# Patient Record
Sex: Female | Born: 1985 | Race: White | Hispanic: No | Marital: Married | Smoking: Current every day smoker
Health system: Southern US, Community
[De-identification: ages and names within clinical notes are randomized; demographics above are authoritative.]

## PROBLEM LIST (undated history)

## (undated) DIAGNOSIS — J039 Acute tonsillitis, unspecified: Secondary | ICD-10-CM

## (undated) DIAGNOSIS — R238 Other skin changes: Secondary | ICD-10-CM

## (undated) DIAGNOSIS — R531 Weakness: Secondary | ICD-10-CM

## (undated) DIAGNOSIS — S46819A Strain of other muscles, fascia and tendons at shoulder and upper arm level, unspecified arm, initial encounter: Secondary | ICD-10-CM

## (undated) DIAGNOSIS — E282 Polycystic ovarian syndrome: Secondary | ICD-10-CM

## (undated) DIAGNOSIS — R233 Spontaneous ecchymoses: Secondary | ICD-10-CM

## (undated) DIAGNOSIS — R634 Abnormal weight loss: Secondary | ICD-10-CM

## (undated) DIAGNOSIS — R1033 Periumbilical pain: Secondary | ICD-10-CM

## (undated) DIAGNOSIS — J309 Allergic rhinitis, unspecified: Secondary | ICD-10-CM

## (undated) DIAGNOSIS — R51 Headache: Secondary | ICD-10-CM

## (undated) DIAGNOSIS — G43909 Migraine, unspecified, not intractable, without status migrainosus: Secondary | ICD-10-CM

## (undated) DIAGNOSIS — R197 Diarrhea, unspecified: Secondary | ICD-10-CM

## (undated) DIAGNOSIS — R519 Headache, unspecified: Secondary | ICD-10-CM

## (undated) DIAGNOSIS — S43499A Other sprain of unspecified shoulder joint, initial encounter: Secondary | ICD-10-CM

## (undated) HISTORY — DX: Strain of other muscles, fascia and tendons at shoulder and upper arm level, unspecified arm, initial encounter: S46.819A

## (undated) HISTORY — DX: Headache, unspecified: R51.9

## (undated) HISTORY — DX: Abnormal weight loss: R63.4

## (undated) HISTORY — DX: Headache: R51

## (undated) HISTORY — DX: Weakness: R53.1

## (undated) HISTORY — DX: Other sprain of unspecified shoulder joint, initial encounter: S43.499A

## (undated) HISTORY — DX: Other skin changes: R23.8

## (undated) HISTORY — DX: Migraine, unspecified, not intractable, without status migrainosus: G43.909

## (undated) HISTORY — DX: Periumbilical pain: R10.33

## (undated) HISTORY — DX: Allergic rhinitis, unspecified: J30.9

## (undated) HISTORY — DX: Polycystic ovarian syndrome: E28.2

## (undated) HISTORY — DX: Diarrhea, unspecified: R19.7

## (undated) HISTORY — DX: Spontaneous ecchymoses: R23.3

## (undated) HISTORY — DX: Acute tonsillitis, unspecified: J03.90

---
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
## 2007-07-13 ENCOUNTER — Ambulatory Visit: Payer: Self-pay | Admitting: Internal Medicine

## 2007-07-13 LAB — CONVERTED CEMR LAB: Streptococcus, Group A Screen (Direct): NEGATIVE

## 2008-01-12 ENCOUNTER — Ambulatory Visit: Payer: Self-pay | Admitting: Internal Medicine

## 2008-01-12 DIAGNOSIS — R519 Headache, unspecified: Secondary | ICD-10-CM | POA: Insufficient documentation

## 2008-01-12 DIAGNOSIS — R51 Headache: Secondary | ICD-10-CM | POA: Insufficient documentation

## 2008-02-07 ENCOUNTER — Ambulatory Visit: Payer: Self-pay | Admitting: Internal Medicine

## 2008-02-07 DIAGNOSIS — R42 Dizziness and giddiness: Secondary | ICD-10-CM | POA: Insufficient documentation

## 2008-02-07 DIAGNOSIS — J309 Allergic rhinitis, unspecified: Secondary | ICD-10-CM

## 2008-02-07 HISTORY — DX: Allergic rhinitis, unspecified: J30.9

## 2008-02-08 ENCOUNTER — Ambulatory Visit: Payer: Self-pay | Admitting: Internal Medicine

## 2008-02-14 ENCOUNTER — Encounter: Payer: Self-pay | Admitting: Internal Medicine

## 2008-02-14 ENCOUNTER — Ambulatory Visit: Payer: Self-pay

## 2008-02-20 ENCOUNTER — Ambulatory Visit: Payer: Self-pay | Admitting: Internal Medicine

## 2008-02-28 ENCOUNTER — Encounter: Admission: RE | Admit: 2008-02-28 | Discharge: 2008-02-28 | Payer: Self-pay | Admitting: Internal Medicine

## 2008-02-29 ENCOUNTER — Encounter: Payer: Self-pay | Admitting: Internal Medicine

## 2008-03-07 ENCOUNTER — Emergency Department (HOSPITAL_COMMUNITY): Admission: EM | Admit: 2008-03-07 | Discharge: 2008-03-08 | Payer: Self-pay | Admitting: Emergency Medicine

## 2008-09-13 ENCOUNTER — Ambulatory Visit: Payer: Self-pay | Admitting: Internal Medicine

## 2008-09-13 DIAGNOSIS — J039 Acute tonsillitis, unspecified: Secondary | ICD-10-CM | POA: Insufficient documentation

## 2008-09-13 HISTORY — DX: Acute tonsillitis, unspecified: J03.90

## 2008-12-24 ENCOUNTER — Ambulatory Visit: Payer: Self-pay | Admitting: Internal Medicine

## 2009-01-07 ENCOUNTER — Encounter: Payer: Self-pay | Admitting: Internal Medicine

## 2009-05-03 ENCOUNTER — Telehealth: Payer: Self-pay | Admitting: Internal Medicine

## 2010-02-03 ENCOUNTER — Ambulatory Visit: Payer: Self-pay | Admitting: Internal Medicine

## 2010-02-03 DIAGNOSIS — S43499A Other sprain of unspecified shoulder joint, initial encounter: Secondary | ICD-10-CM

## 2010-02-03 DIAGNOSIS — G43909 Migraine, unspecified, not intractable, without status migrainosus: Secondary | ICD-10-CM

## 2010-02-03 DIAGNOSIS — S46819A Strain of other muscles, fascia and tendons at shoulder and upper arm level, unspecified arm, initial encounter: Secondary | ICD-10-CM

## 2010-02-03 HISTORY — DX: Other sprain of unspecified shoulder joint, initial encounter: S43.499A

## 2010-02-03 HISTORY — DX: Migraine, unspecified, not intractable, without status migrainosus: G43.909

## 2010-07-21 ENCOUNTER — Emergency Department (HOSPITAL_COMMUNITY): Admission: EM | Admit: 2010-07-21 | Discharge: 2010-07-21 | Payer: Self-pay | Admitting: Emergency Medicine

## 2010-07-30 ENCOUNTER — Telehealth: Payer: Self-pay | Admitting: Internal Medicine

## 2010-08-11 ENCOUNTER — Telehealth: Payer: Self-pay | Admitting: Internal Medicine

## 2010-08-18 ENCOUNTER — Ambulatory Visit: Payer: Self-pay | Admitting: Internal Medicine

## 2010-08-26 ENCOUNTER — Encounter: Payer: Self-pay | Admitting: Internal Medicine

## 2010-10-18 DIAGNOSIS — R87619 Unspecified abnormal cytological findings in specimens from cervix uteri: Secondary | ICD-10-CM

## 2010-10-18 HISTORY — DX: Unspecified abnormal cytological findings in specimens from cervix uteri: R87.619

## 2010-11-15 LAB — CONVERTED CEMR LAB
ALT: 13 units/L (ref 0–35)
Alkaline Phosphatase: 51 units/L (ref 39–117)
BUN: 6 mg/dL (ref 6–23)
Basophils Relative: 0 % (ref 0.0–1.0)
Bilirubin Urine: NEGATIVE
Bilirubin, Direct: 0.1 mg/dL (ref 0.0–0.3)
CO2: 28 meq/L (ref 19–32)
Creatinine, Ser: 0.7 mg/dL (ref 0.4–1.2)
Eosinophils Absolute: 0.2 10*3/uL (ref 0.0–0.7)
Eosinophils Relative: 4.6 % (ref 0.0–5.0)
GFR calc Af Amer: 137 mL/min
Hemoglobin, Urine: NEGATIVE
Hemoglobin: 13.7 g/dL (ref 12.0–15.0)
Leukocytes, UA: NEGATIVE
Monocytes Relative: 10.1 % (ref 3.0–12.0)
Neutro Abs: 2 10*3/uL (ref 1.4–7.7)
Platelets: 320 10*3/uL (ref 150–400)
Potassium: 4.2 meq/L (ref 3.5–5.1)
RBC: 4.2 M/uL (ref 3.87–5.11)
Total Bilirubin: 0.5 mg/dL (ref 0.3–1.2)
Total Protein, Urine: NEGATIVE mg/dL
Total Protein: 6.8 g/dL (ref 6.0–8.3)
WBC: 4.1 10*3/uL — ABNORMAL LOW (ref 4.5–10.5)

## 2010-11-17 NOTE — Consult Note (Signed)
Summary: [facility]  [facility]   Imported By: Lester Cottondale 09/01/2010 09:10:00  _____________________________________________________________________  External Attachment:    Type:   Image     Comment:   External Document

## 2010-11-17 NOTE — Progress Notes (Signed)
Summary: Rx for migraine?  Phone Note Call from Patient Call back at Home Phone [phone]   Caller: Dad Summary of Call: Pt's father called stating pt does not have appt with Neuro for migraines until 12/21. Pt is requesting Rx for Amitriptyline to use preventively until appt. Please advise. Initial call taken by: Margaret Pyle, CMA,  August 11, 2010 8:18 AM  Follow-up for Phone Call        done hardcopy to LIM side B - dahlia  Follow-up by: [name] MD,  August 11, 2010 12:55 PM  Additional Follow-up for Phone Call Additional follow up Details #1::        Pt informed via VM, req a call back to advise of pharmacy. Margaret Pyle, CMA  August 11, 2010 1:06 PM   Pt called back and req Rx fax to [facility]. Rx faxed. Additional Follow-up by: Margaret Pyle, CMA,  August 11, 2010 4:51 PM    New/Updated Medications: AMITRIPTYLINE HCL 10 MG TABS (AMITRIPTYLINE HCL) 2 by mouth at bedtime Prescriptions: AMITRIPTYLINE HCL 10 MG TABS (AMITRIPTYLINE HCL) 2 by mouth at bedtime  #60 x 5   Entered and Authorized by:   [name] MD   Signed by:   [name] MD on 08/11/2010   Method used:   Print then Give to Patient   RxID:   [id number]

## 2010-11-17 NOTE — Progress Notes (Signed)
Summary: Referral  Phone Note Call from Patient Call back at Home Phone [phone]   Caller: Myrtie Hawk [phone] Summary of Call: Pt's spouse called requesting referral to [facility] for eval of Migraines. Initial call taken by: Margaret Pyle, CMA,  July 30, 2010 1:10 PM  Follow-up for Phone Call        would she prefer [facility] since the neurologist there specializes in migraine? Follow-up by: [name] MD,  July 30, 2010 2:28 PM  Additional Follow-up for Phone Call Additional follow up Details #1::        Pt has been to [facility] and [facility] and was not satisfied with treatment plan by either MD, this is why pt is requesting new referral per spouse Additional Follow-up by: Margaret Pyle, CMA,  July 30, 2010 2:41 PM    Additional Follow-up for Phone Call Additional follow up Details #2::    ok for refferall as she requests- will do Follow-up by: [name] MD,  July 30, 2010 2:48 PM

## 2010-11-17 NOTE — Assessment & Plan Note (Signed)
Summary: MIGRAINE---STC   Vital Signs:  Patient profile:   25 year old female Height:      63.5 inches Weight:      136.25 pounds BMI:     23.84 O2 Sat:      97 % on Room air Temp:     98.5 degrees F oral Pulse rate:   80 / minute BP sitting:   90 / 58  (left arm) Cuff size:   regular  Vitals Entered By: Zella Ball Ewing CMA Duncan Dull) (August 18, 2010 2:39 PM)  O2 Flow:  Room air CC: Migraines/RE   CC:  Migraines/RE.  History of Present Illness: here for f/u'  has ongoing recurring headaches, dx per pt as migraine;  seen per [facility] but not really improving in freq and severity despite mult med trials;  has appt for [facility] in Dec but pain too much recurrent and severe to wiat that long; has taken herself off most of her meds including the depakote, topaax, naproxen, flexeril, gabapentin and tizanidine;  recently started the elavil at night and for a few days seemed to help, but the past 2 days has awoke with headache, feeling also weak and dizzy and had to sit and wait for several minutes to let it pass; Pt denies CP, worsening sob, doe, wheezing, orthopnea, pnd, worsening LE edema, palps, dizziness or syncope  Pt denies new neuro symptoms such as other headache, facial or extremity weakness  No fever, wt loss, night sweats, loss of appetite or other constitutional symptoms Denies worsening depressive symptoms, suicidal ideation, or panic.    Problems Prior to Update: 1)  Migraine Headache  (ICD-346.90) 2)  Sprain&strain Oth Spec Sites Shoulder&upper Arm  (ICD-840.8) 3)  Acute Tonsillitis  (ICD-463) 4)  Allergic Rhinitis  (ICD-477.9) 5)  Dizziness  (ICD-780.4) 6)  Headache  (ICD-784.0)  Medications Prior to Update: 1)  Yaz 3-0.02 Mg  Tabs (Drospirenone-Ethinyl Estradiol) .Marland Kitchen.. 1 By Mouth Qd 2)  Flonase 50 Mcg/act  Susp (Fluticasone Propionate) .... 2 Sprays Each Nostril 3)  Topamax 100 Mg Tabs (Topiramate) 4)  Tizanidine Hcl 4 Mg Tabs (Tizanidine Hcl) 5)  Gabapentin 300 Mg  Caps (Gabapentin) 6)  Prelone 15 Mg/33ml Syrp (Prednisolone) .... Swish and Swallow 5 Ml By Mouth Three Times A Day For 5 Days 7)  Depakote 500 Mg Tbec (Divalproex Sodium) .Marland Kitchen.. 1 By Mouth Two Times A Day 8)  Verapamil Hcl Cr 100 Mg Xr24h-Cap (Verapamil Hcl) .Marland Kitchen.. 1 By Mouth Once Daily 9)  Naproxen 500 Mg Tabs (Naproxen) .Marland Kitchen.. 1po Two Times A Day As Needed Pain 10)  Flexeril 5 Mg Tabs (Cyclobenzaprine Hcl) .Marland Kitchen.. 1po Three Times A Day As Needed 11)  Amitriptyline Hcl 10 Mg Tabs (Amitriptyline Hcl) .... 2 By Mouth At Bedtime  Current Medications (verified): 1)  Yaz 3-0.02 Mg  Tabs (Drospirenone-Ethinyl Estradiol) .Marland Kitchen.. 1 By Mouth Qd 2)  Verapamil Hcl Cr 100 Mg Xr24h-Cap (Verapamil Hcl) .Marland Kitchen.. 1 By Mouth Once Daily 3)  Naproxen 500 Mg Tabs (Naproxen) .Marland Kitchen.. 1po Two Times A Day As Needed Pain 4)  Sumatriptan Succinate 100 Mg Tabs (Sumatriptan Succinate) .Marland Kitchen.. 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   Head:  normocephalic and atraumatic.   Eyes:  vision grossly intact, pupils equal, and pupils round.   Ears:  R ear normal and L ear normal.   Nose:  no external deformity and no nasal discharge.   Mouth:  no gingival abnormalities and pharynx pink and moist.   Neck:  supple and no masses.   Lungs:  normal respiratory effort and normal breath sounds.   Heart:  normal rate and regular rhythm.   Extremities:  no edema, no erythema    Neurologic:  cranial nerves II-XII intact, strength normal in all extremities, and gait normal.     Impression & Recommendations:  Problem # 1:  MIGRAINE HEADACHE (ICD-346.90)  The following medications were removed from the medication list:    Naproxen 500 Mg Tabs (Naproxen) .Marland Kitchen... 1po two times a day as needed pain Her updated medication list for this problem includes:    Naproxen 500 Mg Tabs (Naproxen) .Marland Kitchen... 1po two times a day as needed pain    Sumatriptan Succinate 100 Mg Tabs (Sumatriptan succinate) .Marland Kitchen... 1po every other day as needed treat as above, f/u any worsening signs or symptoms , also refer Dr Leane Para [facility] ongoing eval and treatment  Orders: Neurology Referral (Neuro)  Complete Medication List: 1)  Yaz 3-0.02 Mg Tabs (Drospirenone-ethinyl estradiol) .Marland Kitchen.. 1 by mouth qd 2)  Verapamil Hcl Cr 100 Mg Xr24h-cap (Verapamil hcl) .Marland Kitchen.. 1 by mouth once daily 3)  Naproxen 500 Mg Tabs (Naproxen) .Marland Kitchen.. 1po two times a day as needed pain 4)  Sumatriptan Succinate 100 Mg Tabs (Sumatriptan succinate) .Marland Kitchen.. 1po every other day as needed  Patient Instructions: 1)  Please take all new medications as prescribed  2)  Continue all previous medications as before this visit , except ok to continue off the topamax, and other medications as you have already stopped 3)  You will be contacted about the referral(s) to: Dr Clarisse Gouge  - Neurology 4)  Please schedule a follow-up appointment as needed. Prescriptions: SUMATRIPTAN SUCCINATE 100 MG TABS (SUMATRIPTAN SUCCINATE) 1po every other day as needed  #9 x 0   Entered and Authorized by:   [name] MD   Signed by:   [name] MD on 08/18/2010   Method used:   Print then Give to Patient   RxID:   [id number] NAPROXEN 500 MG TABS (NAPROXEN) 1po two times a day as needed pain  #60 x 2   Entered and Authorized by:   [name] MD   Signed by:   [name] MD on 08/18/2010   Method used:   Print then Give to Patient   RxID:    [id number]    Orders Added: 1)  Neurology Referral [Neuro] 2)  Est. Patient Level III [28413]

## 2010-11-17 NOTE — Assessment & Plan Note (Signed)
Summary: SHOULDER PAIN  D/T--STC   Vital Signs:  Patient profile:   25 year old female Height:      63 inches Weight:      147.50 pounds BMI:     26.22 O2 Sat:      97 % on Room air Temp:     98 degrees F oral Pulse rate:   82 / minute BP sitting:   100 / 62  (left arm) Cuff size:   regular  Vitals Entered ByZella Ball Ewing (February 03, 2010 4:25 PM)  O2 Flow:  Room air CC: Right Shoulder Pain for 3 weeks/RE   CC:  Right Shoulder Pain for 3 weeks/RE.  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  Is right handed.  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  Pain is burning type with sharp exacerbations,  worse to lift to put paper in bins above shoulder level .  No fever, ST, cough , sob, and Pt denies CP, sob, doe, wheezing, orthopnea, pnd, worsening LE edema, palps, dizziness or syncope   No fall, injury, denies spinal or back pain, headache , bowel or bladder change, rash, joint pain , itch or sweling.  No wt loss ,depressive symptoms or panic.   Does state current med tx for migraine not helping as much as he had hoped, infact seem some what worse in freq and severity., and doesnt like the depakote causing wt gain.    Problems Prior to Update: 1)  Sprain&strain Oth Spec Sites Shoulder&upper Arm  (ICD-840.8) 2)  Acute Tonsillitis  (ICD-463) 3)  Allergic Rhinitis  (ICD-477.9) 4)  Dizziness  (ICD-780.4) 5)  Headache  (ICD-784.0)  Medications Prior to Update: 1)  Yaz 3-0.02 Mg  Tabs (Drospirenone-Ethinyl Estradiol) .Marland Kitchen.. 1 By Mouth Qd 2)  Flonase 50 Mcg/act  Susp (Fluticasone Propionate) .... 2 Sprays Each Nostril 3)  Topamax 100 Mg Tabs (Topiramate) 4)  Tizanidine Hcl 4 Mg Tabs (Tizanidine Hcl) 5)  Gabapentin 300 Mg  Caps (Gabapentin) 6)  Prelone 15 Mg/85ml Syrp (Prednisolone) .... Swish and Swallow 5 Ml By Mouth Three Times A Day For 5 Days 7)  Azithromycin 250 Mg Tabs (Azithromycin) .... 2po Qd For 1 Day, Then 1po Qd For 4days, Then Stop  Current Medications (verified): 1)  Yaz 3-0.02 Mg  Tabs (Drospirenone-Ethinyl Estradiol) .Marland Kitchen.. 1 By Mouth Qd 2)  Flonase 50 Mcg/act  Susp (Fluticasone Propionate) .... 2 Sprays Each Nostril 3)  Topamax 100 Mg Tabs (Topiramate) 4)  Tizanidine Hcl 4 Mg Tabs (Tizanidine Hcl) 5)  Gabapentin 300 Mg Caps (Gabapentin) 6)  Prelone 15 Mg/56ml Syrp (Prednisolone) .... Swish and Swallow 5 Ml By Mouth Three Times A Day For 5 Days 7)  Depakote 500 Mg Tbec (Divalproex Sodium) .Marland Kitchen.. 1 By Mouth Two Times A Day 8)  Verapamil Hcl Cr 100 Mg Xr24h-Cap (Verapamil Hcl) .Marland Kitchen.. 1 By Mouth Once Daily 9)  Naproxen 500 Mg Tabs (Naproxen) .Marland Kitchen.. 1po Two Times A Day As Needed Pain 10)  Flexeril 5 Mg Tabs (Cyclobenzaprine Hcl) .Marland Kitchen.. 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   Head:  normocephalic and atraumatic.   Eyes:  vision grossly intact, pupils equal, and pupils round.   Ears:  R ear normal and L ear normal.   Nose:  no external deformity and no nasal discharge.   Mouth:  no gingival abnormalities and pharynx pink and moist.   Neck:  supple and no masses.   Lungs:  normal respiratory effort and normal breath sounds.     Heart:  normal rate and regular rhythm.   Abdomen:  soft, non-tender, and normal bowel sounds.   Msk:  right shoulder NT, FROM  without sweling;  right trapezoid mild to mod tender, no spine tender Neurologic:  cranial nerves II-XII intact, strength normal in all extremities, sensation intact to light touch, gait normal, and DTRs symmetrical and normal.     Impression & Recommendations:  Problem # 1:  SPRAIN&STRAIN OTH SPEC SITES SHOULDER&UPPER ARM (ICD-840.8) c/w MSK strain ; cant r/o pulm completely so will check cxr, but tx with nsaid and flexeril as needed , f/u any worsening symptoms  Orders: T-2 View CXR, Same Day (71020.5TC)  Problem # 2:  MIGRAINE HEADACHE (ICD-346.90)  Her updated medication list for this problem includes:    Naproxen 500 Mg Tabs (Naproxen) .Marland Kitchen... 1po two times a day as needed pain stable to midl worsened overall by hx and exam, ok to continue meds/tx as is , [facility]  Complete Medication List: 1)  Yaz 3-0.02 Mg Tabs (Drospirenone-ethinyl estradiol) .Marland Kitchen.. 1 by mouth qd 2)  Flonase 50 Mcg/act Susp (Fluticasone propionate) .... 2 sprays each nostril 3)  Topamax 100 Mg Tabs (Topiramate) 4)  Tizanidine Hcl 4 Mg Tabs (Tizanidine hcl) 5)  Gabapentin 300 Mg Caps (Gabapentin) 6)  Prelone 15 Mg/68ml Syrp (Prednisolone) .... Swish and swallow 5 ml by mouth three times a day for 5 days 7)  Depakote 500 Mg Tbec (Divalproex sodium) .Marland Kitchen.. 1 by mouth two times a day 8)  Verapamil Hcl Cr 100 Mg Xr24h-cap (Verapamil hcl) .Marland Kitchen.. 1 by mouth once daily 9)  Naproxen 500 Mg Tabs (Naproxen) .Marland Kitchen.. 1po two times a day as needed pain 10)  Flexeril 5 Mg Tabs (Cyclobenzaprine hcl) .Marland Kitchen.. 1po three times a day as needed  Patient Instructions: 1)  Please take all new medications as prescribed 2)  Continue all previous medications as before this visit  3)  Please go to Radiology in the basement level for your X-Ray today  4)  Please schedule a follow-up appointment as  needed. 5)  [facility] Prescriptions: FLEXERIL 5 MG TABS (CYCLOBENZAPRINE HCL) 1po three times a day as needed  #60 x 1   Entered and Authorized by:   [name] MD   Signed by:   [name] MD on 02/03/2010   Method used:   Print then Give to Patient   RxID:   [id number] NAPROXEN 500 MG TABS (NAPROXEN) 1po two times a day as needed pain  #60 x 2   Entered and Authorized by:   Len Blalock  Zahniya Zellars MD   Signed by:   [name] MD on 02/03/2010   Method used:   Print then Give to Patient   RxID:   [id number]

## 2011-03-02 NOTE — Assessment & Plan Note (Signed)
[facility]                             PULMONARY OFFICE NOTE   TANISA, LAGACE                      MRN:          [id number]  DATE:07/13/2007                            DOB:          Feb 22, 1986    HISTORY OF PRESENT ILLNESS:  The patient is a 25 year old white female  patient of Dr. Jonny Ruiz who was in her usual good state of health until 2  days ago when she got nasal congestion, postnasal drip, sore throat,  sneezing, body aches, and general malaise.  The patient denies any  fever, chest pain, shortness of breath, rash, recent travel, nausea,  vomiting, or antibiotic use.   PAST MEDICAL HISTORY:  Reviewed.   CURRENT MEDICATIONS:  Reviewed.   PHYSICAL EXAMINATION:  GENERAL:  The patient is a pleasant female in no  acute distress.  VITAL SIGNS:  Temperature is 99.2, blood pressure is 112/58, O2  saturation is 98% on room air.  HEENT:  Nasal mucosa is slightly pale.  Nontender sinuses.  Conjunctiva  is noninjected.  Posterior oropharynx without any erythema or exudate.  NECK:  Supple, without cervical adenopathy.  Negative nuchal rigidity.  LUNGS:  Lung sounds are clear.  CARDIAC:  Regular rate.  ABDOMEN:  Soft, nontender.  No palpable hepatosplenomegaly.  EXTREMITIES:  Warm, without any edema.  SKIN:  Warm, without rash.   IMPRESSION AND PLAN:  Acute rhinitis flare and Pharygitis .  The patient  is to begin Xyzal 5 mg at bedtime x1 week.  Samples were given.  Nasacort AQ, along with saline and Afrin nasal spray x5 days.  Strep  test is pending.  The patient may  use Mucinex DM as needed for cough  and congestion.  Alternate Tylenol and ibuprofen as needed.  The patient  is to return back as scheduled with Dr. Jonny Ruiz or sooner if needed.      Rubye Oaks, NP  Electronically Signed      Lonzo Cloud. Kriste Basque, MD  Electronically Signed   TP/MedQ  DD: 07/13/2007  DT: 07/14/2007  Job #: [id number]

## 2011-05-20 ENCOUNTER — Encounter: Payer: Self-pay | Admitting: Internal Medicine

## 2011-05-20 ENCOUNTER — Ambulatory Visit (INDEPENDENT_AMBULATORY_CARE_PROVIDER_SITE_OTHER)
Admission: RE | Admit: 2011-05-20 | Discharge: 2011-05-20 | Disposition: A | Discharge status: Home / Self Care | Payer: BC Managed Care – PPO | Source: Ambulatory Visit | Attending: Internal Medicine | Admitting: Internal Medicine

## 2011-05-20 ENCOUNTER — Ambulatory Visit (INDEPENDENT_AMBULATORY_CARE_PROVIDER_SITE_OTHER): Payer: BC Managed Care – PPO | Admitting: Internal Medicine

## 2011-05-20 ENCOUNTER — Other Ambulatory Visit (INDEPENDENT_AMBULATORY_CARE_PROVIDER_SITE_OTHER): Payer: BC Managed Care – PPO

## 2011-05-20 VITALS — BP 100/62 | HR 61 | Temp 98.2°F | Ht 63.0 in | Wt 130.0 lb

## 2011-05-20 DIAGNOSIS — Z0000 Encounter for general adult medical examination without abnormal findings: Secondary | ICD-10-CM

## 2011-05-20 DIAGNOSIS — R109 Unspecified abdominal pain: Secondary | ICD-10-CM

## 2011-05-20 DIAGNOSIS — K921 Melena: Secondary | ICD-10-CM

## 2011-05-20 DIAGNOSIS — Z0001 Encounter for general adult medical examination with abnormal findings: Secondary | ICD-10-CM | POA: Insufficient documentation

## 2011-05-20 DIAGNOSIS — R197 Diarrhea, unspecified: Secondary | ICD-10-CM

## 2011-05-20 LAB — BASIC METABOLIC PANEL
CO2: 18 mEq/L — ABNORMAL LOW (ref 19–32)
Calcium: 8.4 mg/dL (ref 8.4–10.5)
Chloride: 110 mEq/L (ref 96–112)
GFR: 88.85 mL/min (ref >60.00)
Glucose, Bld: 71 mg/dL (ref 70–99)
Potassium: 3.3 mEq/L — ABNORMAL LOW (ref 3.5–5.1)
Sodium: 138 mEq/L (ref 135–145)

## 2011-05-20 LAB — URINALYSIS, ROUTINE W REFLEX MICROSCOPIC
Bilirubin Urine: NEGATIVE
Nitrite: NEGATIVE
pH: 6 (ref 5.0–8.0)

## 2011-05-20 LAB — LIPASE: Lipase: 39 U/L (ref 11.0–59.0)

## 2011-05-20 LAB — CBC WITH DIFFERENTIAL/PLATELET
Basophils Absolute: 0 10*3/uL (ref 0.0–0.1)
Basophils Relative: 0.3 % (ref 0.0–3.0)
Eosinophils Absolute: 0 10*3/uL (ref 0.0–0.7)
HCT: 37 % (ref 36.0–46.0)
Hemoglobin: 12.8 g/dL (ref 12.0–15.0)
MCHC: 34.6 g/dL (ref 30.0–36.0)
RDW: 12.5 % (ref 11.5–14.6)

## 2011-05-20 LAB — HEPATIC FUNCTION PANEL
Alkaline Phosphatase: 44 U/L (ref 39–117)
Bilirubin, Direct: 0.1 mg/dL (ref 0.0–0.3)

## 2011-05-20 LAB — TSH: TSH: 2.09 u[IU]/mL (ref 0.35–5.50)

## 2011-05-20 MED ORDER — HYDROCODONE-ACETAMINOPHEN 5-325 MG PO TABS: 1.0000 | ORAL_TABLET | Freq: Four times a day (QID) | ORAL | Status: DC | PRN

## 2011-05-20 MED ORDER — IOHEXOL 300 MG/ML  SOLN
80.0000 mL | Freq: Once | INTRAMUSCULAR | Status: AC | PRN
  Administered 2011-05-20: 80 mL via INTRAVENOUS

## 2011-05-20 MED ORDER — GABAPENTIN 300 MG PO CAPS: ORAL_CAPSULE | ORAL | Status: DC

## 2011-05-20 NOTE — Assessment & Plan Note (Addendum)
?   Recent viral, vs other type of colitis; for lactoferrin, stool studies, GI referral per pt reqeust, doubt c diff at this time but will check, hold antibx

## 2011-05-20 NOTE — Patient Instructions (Signed)
Take all new medications as prescribed Continue all other medications as before Please go to LAB in the Basement for the blood and/or urine tests to be done today Please call the phone number [phone] (the PhoneTree System) for results of testing in 2-3 days;  When calling, simply dial the number, and when prompted enter the MRN number above (the Medical Record Number) and the # key, then the message should start. You will be contacted regarding the referral for: CT scan, and GI

## 2011-05-20 NOTE — Progress Notes (Signed)
Subjective:    Patient ID: [facility], female    DOB: 1985/10/26, 25 y.o.   MRN: [id number]  HPI  Here to c/o 8 day onset symptoms, [place],  Became nauseas on the way home, then with vomiting when got home, aching all over, watery diarrhea and abd pain severe crampy;  Vomiting lasted 2 days as well as achiness (now resolved), also less watery stools, less frequent (only 3 times in past day), still with abd pain which bothers her the most;  went to urgent care after work  - first time sat July 28 - thought due to viral, to return if worse.  Tx with OTC meds such as pepto bismol and immodium, tx with hyoscymine but did not really help.  Went back to urgent care Tuesday with somewhat improved symtpoms, but abd pain still persisted;  Blood tests and UA neg per pt on July 28, repeated blood count again tues July 31, and remember wbc 5K.  Pain better but still persists today, going on for more than one wk, seems excessive to her., no hx of similar abd pain prior.  No one else sick on trip (husband).  No blood with vomiting,  Did have mild BRB on tissue with some mild anal irriation she thought at the time.  Pain still worse with eating,  Overall wt down 6 lbs since nov 2011, but she does not think most was in the last wk.     Has seen [facility], then [facility] for migraine - now on gabapentin but has not yet increased the dose per [facility].  Past Medical History  Diagnosis Date  . Acute tonsillitis 09/13/2008  . ALLERGIC RHINITIS 02/07/2008  . MIGRAINE HEADACHE 02/03/2010  . SPRAIN&STRAIN OTH SPEC SITES SHOULDER&UPPER ARM 02/03/2010   No past surgical history on file.  reports that she has never smoked. She has never used smokeless tobacco. She reports that she does not drink alcohol or use illicit drugs. family history includes Breast cancer in her maternal grandmother; Diabetes in her father; Gallbladder disease in her  father; Liver disease in her father; and Lung cancer in her maternal grandfather.  There is no history of Colon cancer. No Known Allergies No current outpatient prescriptions on file prior to visit.   No current facility-administered medications on file prior to visit.   Review of Systems Review of Systems  Constitutional: Negative for diaphoresis and unexpected weight change.  HENT: Negative for drooling and tinnitus.   Eyes: Negative for photophobia and visual disturbance.  Respiratory: Negative for choking and stridor.    Genitourinary: Negative for hematuria and decreased urine volume.  Musculoskeletal: Negative for gait problem.  Skin: Negative for color change and wound.  Neurological: Negative for tremors and numbness.  Psychiatric/Behavioral: Negative for decreased concentration. The patient is not hyperactive.      Objective:   Physical Exam BP 100/62  Pulse 61  Temp(Src) 98.2 F (36.8 C) (Oral)  Ht 5\' 3"  (1.6 m)  Wt 130 lb (58.968 kg)  BMI 23.03 kg/m2  SpO2 97%  LMP 05/02/2011 Physical Exam  VS noted Constitutional: Pt appears well-developed and well-nourished.  HENT: Head: Normocephalic.  Right Ear: External ear normal.  Left Ear: External ear normal.  Eyes: Conjunctivae and EOM are normal. Pupils are equal, round, and reactive to light.  Neck: Normal range of motion. Neck supple.  Cardiovascular: Normal rate and regular rhythm.   Pulmonary/Chest: Effort normal  and breath sounds normal.  Abd:  Soft,  non-distended, + BS, diffuse mild tender Neurological: Pt is alert. No cranial nerve deficit.  Skin: Skin is warm. No erythema.  Psychiatric: Pt behavior is normal. Thought content normal.         Assessment & Plan:

## 2011-05-21 ENCOUNTER — Telehealth: Payer: Self-pay | Admitting: Gastroenterology

## 2011-05-21 ENCOUNTER — Other Ambulatory Visit (INDEPENDENT_AMBULATORY_CARE_PROVIDER_SITE_OTHER): Payer: BC Managed Care – PPO

## 2011-05-21 ENCOUNTER — Encounter: Payer: Self-pay | Admitting: Gastroenterology

## 2011-05-21 ENCOUNTER — Telehealth: Payer: Self-pay | Admitting: *Deleted

## 2011-05-21 ENCOUNTER — Ambulatory Visit (HOSPITAL_COMMUNITY)
Admission: RE | Admit: 2011-05-21 | Discharge: 2011-05-21 | Disposition: A | Discharge status: Home / Self Care | Payer: BC Managed Care – PPO | Source: Ambulatory Visit | Attending: Gastroenterology | Admitting: Gastroenterology

## 2011-05-21 ENCOUNTER — Other Ambulatory Visit: Payer: BC Managed Care – PPO

## 2011-05-21 ENCOUNTER — Ambulatory Visit (INDEPENDENT_AMBULATORY_CARE_PROVIDER_SITE_OTHER): Payer: BC Managed Care – PPO | Admitting: Gastroenterology

## 2011-05-21 DIAGNOSIS — R109 Unspecified abdominal pain: Secondary | ICD-10-CM

## 2011-05-21 DIAGNOSIS — G43009 Migraine without aura, not intractable, without status migrainosus: Secondary | ICD-10-CM

## 2011-05-21 DIAGNOSIS — Z791 Long term (current) use of non-steroidal anti-inflammatories (NSAID): Secondary | ICD-10-CM | POA: Insufficient documentation

## 2011-05-21 DIAGNOSIS — R197 Diarrhea, unspecified: Secondary | ICD-10-CM

## 2011-05-21 LAB — IBC PANEL: Transferrin: 269.8 mg/dL (ref 212.0–360.0)

## 2011-05-21 LAB — HIGH SENSITIVITY CRP: CRP, High Sensitivity: 3.34 mg/L (ref 0.000–5.000)

## 2011-05-21 LAB — H. PYLORI ANTIBODY, IGG: H Pylori IgG: NEGATIVE

## 2011-05-21 LAB — FERRITIN: Ferritin: 21.1 ng/mL (ref 10.0–291.0)

## 2011-05-21 LAB — FOLATE: Folate: 22.1 ng/mL (ref >5.9)

## 2011-05-21 MED ORDER — AMBULATORY NON FORMULARY MEDICATION: Status: DC

## 2011-05-21 MED ORDER — ESOMEPRAZOLE MAGNESIUM 40 MG PO CPDR: 40.0000 mg | DELAYED_RELEASE_CAPSULE | Freq: Every day | ORAL | Status: DC

## 2011-05-21 MED ORDER — CILIDINIUM-CHLORDIAZEPOXIDE 2.5-5 MG PO CAPS: 1.0000 | ORAL_CAPSULE | Freq: Three times a day (TID) | ORAL | Status: DC

## 2011-05-21 NOTE — Telephone Encounter (Signed)
Message copied by Leonette Monarch on Fri May 21, 2011 12:47 PM ------      Message from: Jarold Motto, DAVID R      Created: Fri May 21, 2011 12:17 PM       Negative ultrasound for cholelithiasis. She needs endoscopic exam as suggested.

## 2011-05-21 NOTE — Telephone Encounter (Signed)
Advised pt and she will check with work and call back to schedule her EGD.

## 2011-05-21 NOTE — Telephone Encounter (Signed)
OK per Dr Jarold Motto and Yehuda Mao once I called the patient she advised me she ate at 1pm. I told her she would have to call back to schedule unless she knew a date and I could schedule it soon.

## 2011-05-21 NOTE — Patient Instructions (Addendum)
Your prescription(s) have been sent to you pharmacy.  Please go to the basement today for your labs.  Your abdominal ultrasound is scheduled for today 05/21/2011 please arrive at [facility] at 10:45am and have nothing to eat or drinkuntil after your ultrasound.  Low-Fiber and Residue Restricted Diets A low-fiber diet restricts foods that contain carbohydrates that are not digested in the small intestine. A diet containing about 10 grams of fiber is considered low-fiber. The diet needs to be individualized to suit patient tolerances and preferences and to avoid unnecessary restrictions. Generally, the foods emphasized in a low-fiber diet have no skins or seeds. They may have been processed to remove bran, germ, or husks. Cooking may not necessarily eliminate the fiber. Cooking may, in fact, enable a greater quantity of fiber to be consumed in a lesser volume. Legumes and nuts are also restricted. The term low-residue has also been used to describe low-fiber diets, although the two are not the same. Residue refers to any substance that adds to bowel (colonic) contents, such as sloughed cells and intestinal germs (bacteria) in addition to fiber. Residue-containing foods, prunes and prune juice, milk, and connective tissue from meats may also need to be eliminated. It is important to eliminate these foods during sudden (acute) attacks of inflammatory bowel disease, when there is a partial obstruction due to another reason, or when minimal fecal output is desired. When problems are in remission, a more normal diet may be used. PURPOSE  Prevent blockage of a partially obstructed or narrowed gastrointestinal tract.   Reduce stool weight and volume.   Slow the movement of waste.  WHEN IS THIS DIET USED?  Acute phase of Crohn's disease, ulcerative colitis, regional enteritis, or diverticulitis.   Narrowing (stenosis) of intestinal or esophageal tubes (lumina).   Transitional diet following  surgery, injury (trauma), or illness.  ADEQUACY This diet is nutritionally adequate based on individual food choices according to the Recommended Dietary Allowances of the [employer]. SPECIAL NOTES In severe cases, it is recommended that residue containing foods, prunes and prune juice, milk, and connective tissue from meats be eliminated. Check labels, especially on foods from the starch list. Often, dietary fiber content is listed with the nutrition information. Since products are continually introduced or removed from the grocery shelf, this list may not be complete. FOOD GROUP ALLOWED/RECOMMENDED AVOID/USE SPARINGLY  STARCHES 6 servings or more daily    Breads White, [place], and pita breads, plain rolls, buns, or sweet rolls, doughnuts, waffles, pancakes, bagels. Plain muffins, sweet breads, biscuits, matzoth. Flour. Bread, rolls, or crackers made with whole-wheat, multigrains, rye, bran seeds, nuts, or coconut. Corn tortillas, table-shells.  Crackers Soda, saltine, or graham crackers. Pretzels, rusks, melba toast, zwieback. See above. Corn chips, tortilla chips.  Cereals Cooked cereals: cornmeal, farina, cream cereals. Dry cereals: refined corn, wheat, rice, and oat cereals (check label). Cereals containing whole-grains, multigrains, bran, coconut, nuts, or raisins. Cooked or dry oatmeal. Coarse wheat cereals, granola. Cereals advertised as "high fiber."  Potatoes/Pasta/Rice Potatoes prepared any way without skins, refined macaroni, spaghetti, noodles, refined rice. Potato skins. Whole-grain pasta, wild or brown rice. Popcorn.  VEGETABLES 2 to 3 servings or more daily Strained tomato and vegetable juices. Fresh: tender lettuce, cucumber, cabbage, spinach, bean sprouts. Cooked, canned: asparagus, bean sprouts, cut green or wax beans, cauliflower, pumpkin, beets, mushrooms, olives, spinach, yellow squash, tomato, tomato sauce (no seeds), zucchini (peeled), turnips. Canned sweet  potatoes. Small amounts of celery, onion, radish, and green pepper may be  used. Keep servings limited to 1/2 cup. Fresh, cooked, or canned: artichokes, baked beans, beet greens, broccoli, Brussels sprouts, French-style green beans, corn, kale, legumes, peas, sweet potatoes. Cooked: green or red cabbage, spinach. Avoid large servings of any vegetables.  FRUIT 2 to 3 servings or more daily All fruit juices except prune juice. Cooked or canned: apricots applesauce, cantaloupe, cherries, grapefruit, grapes, kiwi, mandarin oranges, peaches, pears, fruit cocktail, pineapple, plums, watermelon. Fresh: banana, grapes, cantaloupe, avocado, cherries, pineapple, grapefruit, kiwi, nectarines, peaches, oranges, blueberries, plums. Keep servings limited to 1/2 cup or 1 piece. Fresh: apple with or without skin, apricots, mango, pears, raspberries, strawberries. Prune juice, stewed or dried prunes. Dried fruits, raisins, dates. Avoid large servings of all fresh fruits.  MEAT AND MEAT SUBSTITUTES 2 servings or more (4 to 6 total daily) Ground or well-cooked tender beef, ham, veal, lamb, pork, or poultry. Eggs, plain cheese. Fish, oysters, shrimp, lobster, other seafood. Liver, organ meats. Tough, fibrous meats with gristle. Peanut butter, smooth or chunky. Cheese with seeds, nuts, or other foods not allowed. Nuts, seeds, legumes, dried peas, beans, lentils.  MILK 2 cups or equivalent daily All milk products except those not allowed. Milk and milk product consumption should be minimal when low residue is desired. Yogurt that contains nuts or seeds.  SOUPS AND COMBINATION FOODS Bouillon, broth, or cream soups made from allowed foods. Any strained soup. Casseroles or mixed dishes made with allowed foods. Soups made from vegetables that are not allowed or that contain other foods not allowed.  DESSERTS AND SWEETS In moderation Plain cakes and cookies, pie made with allowed fruit, pudding, custard, cream pie. Gelatin, fruit,  ice, sherbet, frozen ice pops. Ice cream, ice milk without nuts. Plain hard candy, honey, jelly, molasses, syrup, sugar, chocolate syrup, gumdrops, marshmallows. Desserts, cookies, or candies that contain nuts, peanut butter, or dried fruits. Jams, preserves with seeds, marmalade.  FATS AND OILS In moderation Margarine, butter, cream, mayonnaise, salad oils, plain salad dressings made from allowed foods. Plain gravy, crisp bacon without rind. Seeds, nuts, olives. Avocados.  BEVERAGES All, except those listed to avoid. Fruit juices with high pulp, prune juice.  CONDIMENTS/ MISCELLANEOUS Ketchup, mustard, horseradish, vinegar, cream sauce, cheese sauce, cocoa powder. Spices in moderation: allspice, basil, bay leaves, celery powder or leaves, cinnamon, cumin powder, curry powder, ginger, mace, marjoram, onion or garlic powder, oregano, paprika, parsley flakes, ground pepper, rosemary, sage, savory, tarragon, thyme, turmeric. Coconut, pickles.  A serving is equal to: 1/2 cup for fruits, vegetables, and cooked cereals or 1 piece for foods such as a piece of bread, 1 orange, or 1 apple. For dry cereals and crackers, use serving sizes listed on the label. SAMPLE MEAL PLAN The following menu is provided as a sample. Your daily menu plans will vary. Be sure to include a minimum of the following each day in order to provide essential nutrients for the adult: Starch/Bread/Cereal Group Fruit/Vegetable Group Meat/Meat Substitute Group Milk/Milk Substitute Group 6 servings 5 servings 2 servings 2 servings  Combination foods may count as full or partial servings from various food groups. Fats, desserts, and sweets may be added to the meal plan after the requirements for essential nutrients are met. SAMPLE MENU Breakfast Lunch Supper  1/2 cup orange juice 1/2 cup chicken noodle soup 3 ounces baked chicken  1 boiled egg 2 to 3 ounces sliced roast beef 1/2 cup scalloped potatoes  1 slice white toast 2 slices  seedless rye bread 1/2 cup cooked beets  Margarine Mayonnaise White  dinner roll  3/4 cup cornflakes 1/2 cup tomato juice Margarine  1 cup milk 1 small banana 1/2 cup canned peaches  Beverage Beverage Beverage  Document Released: 03/26/2002 Document Re-Released: 12/29/2009 [facility] Patient Information 2011 [place], [place].

## 2011-05-21 NOTE — Progress Notes (Signed)
History of Present Illness:  This is a very nice 25 year old Caucasian female referred by Dr. Oliver Barre for evaluation of abdominal pain.  10 days as this patient had sudden on set of nausea and vomiting, crampy abdominal pain, and diarrhea. Her other symptoms resolved but her pain has persisted and is described as a constant achy pain made worse by eating. Her diarrhea is completely abated. She has had CT scan of the abdomen which was entirely normal, and is currently on when necessary hydrocodone. Her appetite is poor, she describes general malaise, fatigue, but no hepatobiliary or other systemic symptoms. She does suffer from chronic migraine headaches and uses when necessary Neurontin and Naprosyn. She denies abuse of other NSAIDs, cigarettes, but does use alcohol in moderation. There is no past history of pancreatitis or hepatitis. I do not have her recent laboratory tests for review. She denies any stress, sick family members, or known infectious disease exposure. Currently pending are stool exams for O&P, culture, and C. difficile family history is noncontributory otherwise, her father has had cholelithiasis.. Patient also denies recurrent chronic gastrointestinal issues. No history of any specific food intolerance.   I have reviewed this patient's present history, medical and surgical past history, allergies and medications.     ROS: The remainder of the 10 point ROS is negative     Physical Exam: General well developed well nourished patient in no acute distress, appearing her stated age Eyes PERRLA, no icterus, fundoscopic exam per opthamologist Skin no lesions noted Neck supple, no adenopathy, no thyroid enlargement, no tenderness Chest clear to percussion and auscultation Heart no significant murmurs, gallops or rubs noted Abdomen no hepatosplenomegaly masses or tenderness, BS normal.  Rectal inspection normal no fissures, or fistulae noted.  No masses or tenderness on digital  exam. Stool guaiac negative. Extremities no acute joint lesions, edema, phlebitis or evidence of cellulitis. Neurologic patient oriented x 3, cranial nerves intact, no focal neurologic deficits noted. Psychological mental status normal and normal affect.  Assessment and plan: Unusual scenario of what sounds like acute gastroenteritis with resultant constant abdominal pain of unexplained etiology. Considerations would include NSAID gastropathy and small bowel erosions, acute H. pylori infection, cholelithiasis, versus resolving ischemic bowel disease associated with her birth control use. Have scheduled her for ultrasound, endoscopy, the labs included celiac serologies, and I placed her Librax one 3 times a day, AcipHex 20 mg a day, when necessary GI cocktail, and Avastin continue use hydrocodone as needed.   Encounter Diagnosis  Name Primary?  . Abdominal pain Yes

## 2011-05-24 ENCOUNTER — Other Ambulatory Visit: Payer: BC Managed Care – PPO | Admitting: Gastroenterology

## 2011-05-24 LAB — CELIAC PANEL 10
Gliadin IgA: 20.7 U/mL — ABNORMAL HIGH (ref <20)
Gliadin IgG: 17 U/mL (ref <20)
IgA: 142 mg/dL (ref 69–380)
Tissue Transglutaminase Ab, IgA: 20 U/mL — ABNORMAL HIGH (ref <20)

## 2011-05-24 LAB — STOOL CULTURE

## 2011-05-25 ENCOUNTER — Telehealth: Payer: Self-pay | Admitting: *Deleted

## 2011-05-25 LAB — FECAL LACTOFERRIN, QUANT: Lactoferrin: NEGATIVE

## 2011-05-25 NOTE — Telephone Encounter (Signed)
Message copied by Leonette Monarch on Tue May 25, 2011 10:20 AM ------      Message from: Sheryn Bison R      Created: Tue May 25, 2011  8:37 AM       Appears to have celiac disease..is she scheduled for endoscopy.???

## 2011-05-25 NOTE — Telephone Encounter (Signed)
Patient scheduled for egd on 05/28/2011 and she will also be set up for a previsit.

## 2011-05-26 ENCOUNTER — Ambulatory Visit (AMBULATORY_SURGERY_CENTER): Payer: BC Managed Care – PPO | Admitting: *Deleted

## 2011-05-26 VITALS — Ht 63.0 in | Wt 131.2 lb

## 2011-05-26 DIAGNOSIS — R109 Unspecified abdominal pain: Secondary | ICD-10-CM

## 2011-05-27 ENCOUNTER — Telehealth: Payer: Self-pay | Admitting: Internal Medicine

## 2011-05-27 NOTE — Telephone Encounter (Signed)
All Records faxed to [facility] @ [phone]  05/27/11/KM

## 2011-05-28 ENCOUNTER — Ambulatory Visit (AMBULATORY_SURGERY_CENTER): Payer: BC Managed Care – PPO | Admitting: Gastroenterology

## 2011-05-28 ENCOUNTER — Encounter: Payer: Self-pay | Admitting: Gastroenterology

## 2011-05-28 VITALS — BP 97/56 | HR 73 | Temp 98.5°F | Resp 16 | Ht 63.0 in | Wt 131.0 lb

## 2011-05-28 DIAGNOSIS — R109 Unspecified abdominal pain: Secondary | ICD-10-CM

## 2011-05-28 DIAGNOSIS — R197 Diarrhea, unspecified: Secondary | ICD-10-CM

## 2011-05-28 MED ORDER — SODIUM CHLORIDE 0.9 % IV SOLN: 500.0000 mL | INTRAVENOUS | Status: DC

## 2011-05-28 NOTE — Patient Instructions (Signed)
Discharge instructions given with verbal understanding. Resume previous medications.  

## 2011-05-28 NOTE — Progress Notes (Signed)
INITIAL BP 97/56. PT STATES SHE NORMALLY RUNS A LOW BP. THIS IS NOT ABNORMAL FOR HER. EWM

## 2011-05-30 ENCOUNTER — Encounter: Payer: Self-pay | Admitting: Internal Medicine

## 2011-05-30 NOTE — Assessment & Plan Note (Signed)
Small volume - ? Need further evaluation

## 2011-05-30 NOTE — Assessment & Plan Note (Signed)
Etiology unclear, for CT eval

## 2011-05-31 ENCOUNTER — Telehealth: Payer: Self-pay

## 2011-05-31 NOTE — Telephone Encounter (Signed)

## 2011-06-01 ENCOUNTER — Encounter: Payer: Self-pay | Admitting: Gastroenterology

## 2011-06-04 ENCOUNTER — Telehealth: Payer: Self-pay | Admitting: Gastroenterology

## 2011-06-07 ENCOUNTER — Telehealth: Payer: Self-pay | Admitting: Gastroenterology

## 2011-06-07 NOTE — Telephone Encounter (Signed)
Patients husbands questions answered about her dx and meds.

## 2011-06-07 NOTE — Telephone Encounter (Signed)
Answered questions.  

## 2011-06-07 NOTE — Telephone Encounter (Signed)
Read letter to pts VM and advised her to call back if she has questions.

## 2011-06-08 ENCOUNTER — Telehealth: Payer: Self-pay | Admitting: Gastroenterology

## 2011-06-08 NOTE — Telephone Encounter (Signed)
Answered questions but advised pts husband that since he has so many questions they need to make an office visit to discuss with Dr Jarold Motto to discuss futher

## 2011-06-14 ENCOUNTER — Telehealth: Payer: Self-pay | Admitting: Gastroenterology

## 2011-06-14 NOTE — Telephone Encounter (Signed)
Pt reports she has been on a Gluten free diet for a few weeks and there's been no change in her pain. The pain is in the middle of her stomach, but if she lies on her side, the pain moves to that side. The pain is constant and the only way to relieve the pain is by sitting up and leaning back. She has spoken to her parents and both of them and her sister have all had Cholecystectomies. Pt has had normal CT of abd/pelvis and U/S of abdomen. Pt wants to know if there is another test she can do-HIDA? Please advise.

## 2011-06-14 NOTE — Telephone Encounter (Signed)
lmom for pt to call back

## 2011-06-14 NOTE — Telephone Encounter (Signed)
Notified pt she is scheduled for HIDA scan on 07/02/11 at 0800am, check in at 0745am at Radiology; NPO 6 hr prior. Pt stated understanding.

## 2011-06-14 NOTE — Telephone Encounter (Signed)
yes

## 2011-06-15 ENCOUNTER — Telehealth: Payer: Self-pay | Admitting: Gastroenterology

## 2011-06-15 NOTE — Telephone Encounter (Signed)
Called radiology and her hida can not be moved up that is the first appt.

## 2011-06-23 ENCOUNTER — Encounter (HOSPITAL_COMMUNITY)
Admission: RE | Admit: 2011-06-23 | Discharge: 2011-06-23 | Disposition: A | Discharge status: Home / Self Care | Payer: BC Managed Care – PPO | Source: Ambulatory Visit | Attending: Gastroenterology | Admitting: Gastroenterology

## 2011-06-23 ENCOUNTER — Telehealth: Payer: Self-pay | Admitting: *Deleted

## 2011-06-23 DIAGNOSIS — R109 Unspecified abdominal pain: Secondary | ICD-10-CM | POA: Insufficient documentation

## 2011-06-23 MED ORDER — TECHNETIUM TC 99M MEBROFENIN IV KIT
5.0000 | PACK | Freq: Once | INTRAVENOUS | Status: AC | PRN
  Administered 2011-06-23: 5 via INTRAVENOUS

## 2011-06-23 NOTE — Telephone Encounter (Signed)
Message copied by Florene Glen on Wed Jun 23, 2011  4:57 PM ------      Message from: Jarold Motto, DAVID R      Created: Wed Jun 23, 2011  4:19 PM       PLEASE SCHEDULE OV FOLLOWUP

## 2011-06-23 NOTE — Telephone Encounter (Signed)
Notified pt that Dr Jarold Motto would like for her to f/u with an OV to discuss her pain. Pt given an appt for 06/29/11 at 4pm- she hopes to make this, but her boss will not be able to ok the appt until Monday. Informed pt to call me if she can't make it.  Pt stated her pain has gotten worse even though she is following a gluten free diet. She rates her pain a min. Of 6 and a max of 9 which she states is almost constant.

## 2011-06-23 NOTE — Telephone Encounter (Signed)
Message copied by Florene Glen on Wed Jun 23, 2011  4:51 PM ------      Message from: Jarold Motto, DAVID R      Created: Wed Jun 23, 2011  4:19 PM       PLEASE SCHEDULE OV FOLLOWUP

## 2011-06-24 NOTE — Telephone Encounter (Signed)
Pt called to ask if she can be seen this afternoon because her pain is worse and it's all she can do to work. Dr Jarold Motto is off this pm, but I offered her an appt with Willette Cluster, NP but, I informed her if she's hurting that bad, she needs to go to the ER. Pt asked if she could have a surgical referral. Spoke with Dr Jarold Motto who ordered a surgical referral. Pt given an appt with Dr Gaynelle Adu at [facility] for 07/05/11 at 0915 for a 0945am appt. She was given directions to [facility], the phone number and we agreed to cancel her appt with [place] on 06/29/11 since we can't offer anything else. Pt asked if she can stop the Gluten Free Diet and I instructed her to try a regular diet, but not to eat anything with high fat or fried foods. If she has problems with switching, she can always go back to the Gluten Free diet; pt stated understanding.   Called pt back to inform her the appt in [facility] is for 07/07/11 at the same time; pt stated understanding.

## 2011-06-28 ENCOUNTER — Telehealth: Payer: Self-pay | Admitting: Gastroenterology

## 2011-06-28 MED ORDER — HYDROCODONE-ACETAMINOPHEN 5-500 MG PO TABS: ORAL_TABLET | ORAL | Status: DC

## 2011-06-28 NOTE — Telephone Encounter (Signed)
Pt does have an appt with a surgeon; can we give her anything for pain? Chart shows she had Norco and Naproxen in the past? Thanks.

## 2011-06-28 NOTE — Telephone Encounter (Signed)
Hydrocodone 5mg  mq6h is ok,,,drp

## 2011-06-28 NOTE — Telephone Encounter (Signed)
lmom that Dr Jarold Motto ordered her Vicodin and I will order it from [facility] on [address]. She may call back if she has questions.

## 2011-06-29 ENCOUNTER — Ambulatory Visit: Payer: BC Managed Care – PPO | Admitting: Gastroenterology

## 2011-07-02 ENCOUNTER — Other Ambulatory Visit (HOSPITAL_COMMUNITY): Payer: BC Managed Care – PPO

## 2011-07-07 ENCOUNTER — Ambulatory Visit (INDEPENDENT_AMBULATORY_CARE_PROVIDER_SITE_OTHER): Payer: BC Managed Care – PPO | Admitting: General Surgery

## 2011-07-07 ENCOUNTER — Encounter (INDEPENDENT_AMBULATORY_CARE_PROVIDER_SITE_OTHER): Payer: Self-pay | Admitting: General Surgery

## 2011-07-07 VITALS — BP 110/78 | HR 60 | Temp 97.2°F | Resp 16 | Ht 63.0 in | Wt 130.0 lb

## 2011-07-07 DIAGNOSIS — K8050 Calculus of bile duct without cholangitis or cholecystitis without obstruction: Secondary | ICD-10-CM

## 2011-07-07 DIAGNOSIS — K8020 Calculus of gallbladder without cholecystitis without obstruction: Secondary | ICD-10-CM

## 2011-07-07 NOTE — Patient Instructions (Signed)
Biliary Colic  Biliary colic is a steady or irregular pain in the upper abdomen. It is usually under the right side of the rib cage. It happens when gallstones interfere with the normal flow of bile from the gallbladder. Bile is a liquid that helps to digest fats. Bile is made in the liver and stored in the gallbladder. When you eat a meal, bile passes from the gallbladder through the cystic duct and the common bile duct into the small intestine. There, it mixes with partially digested food. If a gallstone blocks either of these ducts, the normal flow of bile is blocked. The muscle cells in the bile duct contract forcefully to try to move the stone. This causes the pain of biliary colic.  SYMPTOMS  A person with biliary colic usually complains of pain in the upper abdomen. This pain can be:   In the center of the upper abdomen just below the breastbone.   In the upper-right part of the abdomen, near the gallbladder and liver.   Spread back toward the right shoulder blade.   Nausea and vomiting.   The pain usually occurs after eating.   Biliary colic is usually triggered by the digestive system's demand for bile. The demand for bile is high after fatty meals. Symptoms can also occur when a person who has been fasting suddenly eats a very large meal. Most episodes of biliary colic pass after one to five hours. After the most intense pain passes, your abdomen may continue to ache mildly for about 24 hours.  DIAGNOSIS After you describe your symptoms, your caregiver will perform a physical exam. He or she will pay attention to the upper right portion of your belly (abdomen). This is the area of your liver and gall bladder. An ultrasound will help your caregiver look for gallstones. Specialized scans of the gallbladder may also be done. Blood tests may be done, especially if you have fever or if your pain persists. PREVENTION Biliary colic can be prevented by controlling the risk factors for  gallstones. Some of these risk factors, such as heredity, increasing age and pregnancy are a normal part of life. Obesity and a high-fat diet are risk factors you can change through a healthy lifestyle. Women going through menopause who take hormone replacement therapy (estrogen) are also more likely to develop biliary colic. TREATMENT  Pain medication may be prescribed.   You may be encouraged to eat a fat-free diet.   If the first episode of biliary colic is severe, or episodes of colic keep retuning, surgery to remove the gallbladder (cholecystectomy) is usually recommended. This procedure can be done through small incisions using an instrument called a laparoscope. The procedure often requires a brief stay in the hospital. Some people can leave the hospital the same day. It is the most widely used treatment in people troubled by painful gallstones. It is effective and safe, with no complications in more than 90% of cases.   If surgery cannot be done, medication that dissolves gallstones may be used. This medication is expensive and can take months or years to work. Only small stones will dissolve.   Rarely, medication to dissolve gallstones is combined with a procedure called shock-wave lithotripsy. This procedure uses carefully aimed shock waves to break up gallstones. In many people treated with this procedure, gallstones form again within a few years.  PROGNOSIS If gallstones block your cystic duct or common bile duct, you are at risk for repeated episodes of biliary colic. There is  also a 25% chance that you will develop acute cholecystitis, or some other complication of gallstones within 10 to 20 years. If you have surgery, schedule it at a time that is convenient for you and at a time when you are not sick. HOME CARE INSTRUCTIONS  Drink plenty of clear fluids.   Avoid fatty, greasy or fried foods, or any foods that make your pain worse.   Take medications as directed.  SEEK MEDICAL  CARE IF:  You develop a fever over 100.5 F (38.1 C).   Your pain gets worse over time.   You develop nausea that prevents you from eating and drinking.   You develop vomiting.  SEEK IMMEDIATE MEDICAL CARE IF:  You have continuous or severe belly (abdominal) pain which is not relieved with medications.   You develop nausea and vomiting which is not relieved with medications.   You have symptoms of biliary colic and you suddenly develop a fever and shaking chills. This may signal a gallbladder infection (cholecystitis). Call your caregiver immediately.   You develop a yellow color to your skin or the white part of your eyes (jaundice).  Document Released: 03/07/2006 Document Re-Released: 07/13/2008 [facility] Patient Information 2011 [place], [place].

## 2011-07-07 NOTE — Progress Notes (Signed)
Chief Complaint  Patient presents with  . Other    eval of gallbladder     HPI Alicia Jones is a 25 y.o. female.   HPI 25 year old Caucasian female referred by Dr. Jarold Motto for evaluation of abdominal pain. The patient states that she was in her usual state of health until this past July when she returned from a trip from [place], [place] the end of July. She became nauseous while driving in the car. Once they arrived at home, she had multiple episodes of emesis and diarrhea and abdominal pain. The emesis lasted for 2 days. She had loose stools for several days. Her symptoms started to improve however she continued to have some abdominal pain. Therefore she went to urgent care and was diagnosed with gastroenteritis.  She has had primarily continued abdominal pain. She states it is now constant. It is generally a 5 out 10. She says the pain is mainly on her right side, under her rib cage. She will have pain radiates to her back. She will have occasional nausea as well. She has not had any fevers chills or emesis she denies any melena, hematochezia, jaundice, or a cholic stools. She says that her pain will worsen after eating meals. She states that she has noticed a particular association with eating greasy foods. It will become a stabbing pain. She states that she is now having alternating bouts of diarrhea and constipation. She states that she lost about 5 pounds through this whole ordeal. She's undergone a CT scan of her abdomen and pelvis, upper endoscopy, abdominal ultrasound, and HIDA scan. She denies any foreign travel. She denies any new medications. Her stool has been cultured and all cultures have been negative. Her C. difficile assay was negative. They even tried a gluten-free diet without any amelioration of her abdominal pain. She is also been tried on Nexium without any relief. Past Medical History  Diagnosis Date  . Acute tonsillitis 09/13/2008  . ALLERGIC RHINITIS 02/07/2008  .  MIGRAINE HEADACHE 02/03/2010  . SPRAIN&STRAIN OTH SPEC SITES SHOULDER&UPPER ARM 02/03/2010  . Weight loss, unintentional   . Chest pain   . Nausea and vomiting   . Constipation   . Abdominal pain   . Chest pain   . Diarrhea   . Generalized headaches   . Weakness   . Easy bruising     History reviewed. No pertinent past surgical history.  Family History  Problem Relation Age of Onset  . Diabetes Father   . Gallbladder disease Father   . Liver disease Father   . Breast cancer Maternal Grandmother   . Lung cancer Maternal Grandfather   . Colon cancer Neg Hx     Social History History  Substance Use Topics  . Smoking status: Never Smoker   . Smokeless tobacco: Never Used  . Alcohol Use: Yes     social    Not on File  Current Outpatient Prescriptions  Medication Sig Dispense Refill  . AMBULATORY NON FORMULARY MEDICATION GI Cocktail 20CC Maalox, 10CC Donatal, 20CC Xylocaine. Swish and swallow as needed  600 mL  0  . cetirizine (ZYRTEC) 10 MG tablet Take 10 mg by mouth as needed.        . gabapentin (NEURONTIN) 300 MG capsule 2 (two) times daily. 1 capsule by mouth twice per day       . HYDROcodone-acetaminophen (VICODIN) 5-500 MG per tablet Take one tablet by mouth every 6 hours for abdominal pain.  20 tablet  0  .  Levonorgestrel-Ethinyl Estradiol (AMETHIA,CAMRESE) 0.1-0.02 & 0.01 MG tablet Take 1 tablet by mouth daily.       Marland Kitchen topiramate (TOPAMAX) 50 MG tablet 100 mg 2 (two) times daily.      Marland Kitchen PROCTOFOAM HC rectal foam        Current Facility-Administered Medications  Medication Dose Route Frequency Provider Last Rate Last Dose  . 0.9 %  sodium chloride infusion  500 mL Intravenous Continuous Sheryn Bison, MD        Review of Systems Review of Systems  Constitutional: Positive for appetite change (avoids greasy foods) and fatigue. Negative for fever, diaphoresis and activity change.  HENT: Negative for congestion, neck pain and neck stiffness.        Migraines  about once/week  Respiratory: Negative for cough, chest tightness and shortness of breath.   Cardiovascular: Negative.  Negative for leg swelling.  Gastrointestinal:       See hpi;  nonbloody diarrhea   Genitourinary: Negative for dysuria, hematuria, vaginal bleeding, vaginal discharge, enuresis and difficulty urinating.  Musculoskeletal: Negative for joint swelling, arthralgias and gait problem.  Neurological: Positive for dizziness and headaches. Negative for tremors, seizures, syncope and speech difficulty.  Hematological: Negative.   Psychiatric/Behavioral: Negative.     Blood pressure 110/78, pulse 60, temperature 97.2 F (36.2 C), resp. rate 16, height 5\' 3"  (1.6 m), weight 130 lb (58.968 kg).  Physical Exam Physical Exam  Vitals reviewed. Constitutional: She is oriented to person, place, and time. She appears well-developed and well-nourished.  HENT:  Head: Normocephalic and atraumatic.  Eyes: Conjunctivae are normal. No scleral icterus.  Neck: Normal range of motion. Neck supple. No tracheal deviation present. No thyromegaly present.  Cardiovascular: Normal rate, regular rhythm and normal heart sounds.   Pulmonary/Chest: Effort normal and breath sounds normal. No respiratory distress. She has no wheezes.  Abdominal: Soft. Bowel sounds are normal. She exhibits no distension and no mass. There is tenderness (mild rt TTP). There is no rebound and no guarding.  Musculoskeletal: Normal range of motion. She exhibits no edema and no tenderness.  Lymphadenopathy:    She has no cervical adenopathy.  Neurological: She is alert and oriented to person, place, and time. She exhibits normal muscle tone.  Skin: Skin is warm and dry.       No jaundice; tattoo LLQ abd wall  Psychiatric: She has a normal mood and affect. Her behavior is normal. Judgment and thought content normal.    Data Reviewed Dr Norval Gable note. Dr Raphael Gibney note.  EGD - normal, negative bx CT abd/pelvis - normal [place]-  no stones, o/w negative HIDA - EF at 74%, +symptoms with CCK infusion Labs reviewed  Assessment    Abdominal pain c/w biliary colic Migraines Allergic Rhinitis Dizziness    Plan    Even though her radiological workup was negative for gallbladder problems, her symptoms are fairly consistent with gallbladder disease. She does have a few atypical symptoms that are not often associated with gallbladder disease. However her description of having worsening pain after meals especially with greasy foods is fairly consistent with a gallbladder etiology.   Therefore I have offered her a laparoscopic cholecystectomy. I did explain to her there is a 20-30% chance that cholecystectomy may not ameliorate all of her symptoms. She understands this and would like to proceed with surgery.  I discussed the procedure in detail.  The patient was given Agricultural engineer.  We discussed the risks and benefits of a laparoscopic cholecystectomy including, but not limited to  bleeding, infection, injury to surrounding structures such as the intestine or liver, bile leak, retained gallstones, need to convert to an open procedure, prolonged diarrhea, blood clots such as  DVT, common bile duct injury, anesthesia risks, and possible need for additional procedures.  We discussed the typical post-operative recovery course.     Mary Sella. Andrey Campanile, MD   Gaynelle Adu M 07/07/2011, 10:20 AM

## 2011-07-09 ENCOUNTER — Encounter (HOSPITAL_COMMUNITY)
Admission: RE | Admit: 2011-07-09 | Discharge: 2011-07-09 | Disposition: A | Discharge status: Home / Self Care | Payer: BC Managed Care – PPO | Source: Ambulatory Visit | Attending: General Surgery | Admitting: General Surgery

## 2011-07-09 LAB — CBC
MCH: 33.2 pg (ref 26.0–34.0)
MCHC: 36.7 g/dL — ABNORMAL HIGH (ref 30.0–36.0)
MCV: 90.6 fL (ref 78.0–100.0)
Platelets: 240 10*3/uL (ref 150–400)
RBC: 3.94 MIL/uL (ref 3.87–5.11)

## 2011-07-12 ENCOUNTER — Observation Stay (HOSPITAL_COMMUNITY)
Admission: RE | Admit: 2011-07-12 | Discharge: 2011-07-13 | Disposition: A | Discharge status: Home / Self Care | Payer: BC Managed Care – PPO | Source: Ambulatory Visit | Attending: General Surgery | Admitting: General Surgery

## 2011-07-12 ENCOUNTER — Telehealth (INDEPENDENT_AMBULATORY_CARE_PROVIDER_SITE_OTHER): Payer: Self-pay | Admitting: General Surgery

## 2011-07-12 ENCOUNTER — Other Ambulatory Visit (INDEPENDENT_AMBULATORY_CARE_PROVIDER_SITE_OTHER): Payer: Self-pay | Admitting: General Surgery

## 2011-07-12 ENCOUNTER — Ambulatory Visit (HOSPITAL_COMMUNITY): Payer: BC Managed Care – PPO

## 2011-07-12 DIAGNOSIS — Z01812 Encounter for preprocedural laboratory examination: Secondary | ICD-10-CM | POA: Insufficient documentation

## 2011-07-12 DIAGNOSIS — K811 Chronic cholecystitis: Principal | ICD-10-CM | POA: Insufficient documentation

## 2011-07-12 DIAGNOSIS — R1011 Right upper quadrant pain: Secondary | ICD-10-CM | POA: Insufficient documentation

## 2011-07-12 DIAGNOSIS — G43909 Migraine, unspecified, not intractable, without status migrainosus: Secondary | ICD-10-CM | POA: Insufficient documentation

## 2011-07-12 HISTORY — PX: LAPAROSCOPIC CHOLECYSTECTOMY W/ CHOLANGIOGRAPHY: SUR757

## 2011-07-12 NOTE — Telephone Encounter (Signed)
Message copied by Liliana Cline on Mon Jul 12, 2011  8:47 AM ------      Message from: Andrey Campanile, ERIC M      Created: Fri Jul 09, 2011  6:06 PM       Labs ok for surgery

## 2011-07-12 NOTE — Telephone Encounter (Signed)
Labs okay for surgery faxed to pre-op.  

## 2011-07-14 LAB — URINALYSIS, ROUTINE W REFLEX MICROSCOPIC
Hgb urine dipstick: NEGATIVE
Nitrite: NEGATIVE
Specific Gravity, Urine: 1.004 — ABNORMAL LOW
Urobilinogen, UA: 0.2
pH: 7.5

## 2011-07-14 LAB — POCT PREGNANCY, URINE: Preg Test, Ur: NEGATIVE

## 2011-07-19 HISTORY — PX: CHOLECYSTECTOMY: SHX55

## 2011-07-23 ENCOUNTER — Telehealth (INDEPENDENT_AMBULATORY_CARE_PROVIDER_SITE_OTHER): Payer: Self-pay | Admitting: General Surgery

## 2011-07-23 NOTE — Telephone Encounter (Signed)
Pt called stating she is going back to work on Monday. Wants to know how much she can lift, post op. Pt advised she can be called back at home [phone]. Had lap chole sx 07/12/11.

## 2011-07-23 NOTE — Telephone Encounter (Signed)
Can resume full activities on Monday oct 8

## 2011-07-23 NOTE — Telephone Encounter (Signed)
Patient made aware she should have no restrictions at this point. She will call back if she needs a RTW note but declined at this time. Will call back with any problems and keep her follow up appt.

## 2011-07-26 ENCOUNTER — Other Ambulatory Visit (INDEPENDENT_AMBULATORY_CARE_PROVIDER_SITE_OTHER): Payer: Self-pay | Admitting: General Surgery

## 2011-07-29 NOTE — Op Note (Signed)
Alicia, Jones NO.:  [id number]  MEDICAL RECORD NO.:  [id number]  LOCATION:  [facility]                         FACILITY:  [facility]  PHYSICIAN:  Mary Sella. Andrey Campanile, MD     DATE OF BIRTH:  12-29-1985  DATE OF PROCEDURE:  07/12/2011 DATE OF DISCHARGE:                              OPERATIVE REPORT   PREOPERATIVE DIAGNOSIS:  Right upper quadrant pain, possible biliary colic.  POSTOPERATIVE DIAGNOSIS:  Right upper quadrant pain, possible biliary colic.  PROCEDURE:  Laparoscopic cholecystectomy with intraoperative cholangiogram.  SURGEON:  Mary Sella. Andrey Campanile, MD  ASSISTANT SURGEON:  Clovis Pu. Cornett, MD  ANESTHESIA:  General plus 30 mL of 0.25% Marcaine with epi.  SPECIMEN:  Gallbladder.  ESTIMATED BLOOD LOSS:  Minimal.  FINDINGS:  A critical view was obtained.  The cholangiogram demonstrated prompt opacification of the cystic duct, common hepatic, common bile duct as well as the left to right hepatic ducts.  There was also prompt emptying of the contrast into the duodenum.  There was no evidence of filling defect.  INDICATIONS FOR PROCEDURE:  The patient is a 25 year old Caucasian female who was in previously good health until this summer when she developed multiple episodes of emesis, diarrhea, and abdominal pain and that lasted for several days in July.  Her emesis and diarrhea improved, but she continued to have intermittent episodes of abdominal pain. Primarily, her pain is now constant, she rates it as a 5/10.  It is mainly on her right side underneath the rib cage.  It will radiate to her back.  It does worsen after eating meals.  She has had thorough workup including a CT scan of her abdomen and pelvis, upper endoscopy, abdominal ultrasound, HIDA scan all of which were essentially negative. We discussed the radiological results in the setting of her symptoms. She even tried a gluten free diet without any resolution of her abdominal pain.  I offered her  laparoscopic cholecystectomy.  We discussed the risks and benefits of surgery including but not limited to bleeding, infection, injury to surrounding structures, bile leak, prolonged diarrhea, blood clot formation, general anesthesia risk, injury to the common bile duct requiring major reconstructive bile duct surgery retaining gallstones, need to convert to an open procedure as well as failure to ameliorate her abdominal pain.  She elected to proceed with surgery.  DESCRIPTION OF PROCEDURE:  After obtaining informed consent, the patient was brought to the operating room and placed supine on the operating room table.  Sequential compression devices were placed.  General endotracheal anesthesia was established.  Her abdomen was prepped and draped in usual standard surgical fashion with ChloraPrep.  A surgical time-out was performed.  She received cefoxitin prior to skin incision. Local was infiltrated at the base of her umbilicus.  Next, a 1.5-cm vertical infraumbilical incision was made with a #11 blade, fascia was grasped and lifted anteriorly.  Next, the fascia was incised with the #11 blade and the abdominal cavity was entered.  A pursestring suture was placed around the fascial edges consisting of 0 Vicryl in UR-6 needle.  A 12-mm Hasson trocar was placed and the pneumoperitoneum was smoothly established up to a patient pressure of  15 mmHg.  The laparoscope was advanced.  There was no evidence of abnormal pathology in the upper abdomen that was readily visible.  We placed a reverse Trendelenburg and rotated her to the left.  Three 5-mm trocars were then placed, 1 in the subxiphoid, 2 in the right hypochondrium position all under direct visualization after local was infiltrated. The gallbladder was grasped and retracted towards the right shoulder. Her liver appeared normal other than what appeared to be a little bit of vascular congestion halfway through the procedure prior to  clipping any structures.  The neck was grasped and retracted laterally.  The node of Calot was identified.  The cystic duct was identified and it was circumferentially dissected out around with the aid of a Teaching laboratory technician.  The critical view was obtained.  There was no other structures entering the gallbladder except for the cystic artery which was superior and slightly posterior to the cystic duct.  I placed a clip on the cystic duct as it entered the gallbladder, then partially transected it with Endo shears.  A Cook cholangiogram catheter was percutaneously introduced through the right upper quadrant abdominal and threaded into the cystic duct.  It was secured with a clip.  The cholangiogram was performed with the above-mentioned results.  We reestablished the pneumoperitoneum and placed her back into operating position.  A clip securing the cholangiogram catheter was removed as well as the cholangiogram catheter.  Three clips were placed on the biliary aspect of the cystic duct.  It was then transected with Endo shears.  Two clips were placed on the downside of the cystic artery and one distally as it entered the gallbladder.  It was then transected with Endo shears.  We then rolled the gallbladder up at the gallbladder fossa using hook electrocautery.  The gallbladder was freed.  The laparoscope was placed in the subxiphoid position and the endobag was advanced through the umbilical trocar.  The gallbladder was placed within the specimen bag and extracted through the umbilical trocar. Pneumoperitoneum was reestablished.  We reinspected the gallbladder fossa.  There was no evidence of bleeding or bile leak.  The clips were securely across the ductal structure.  The right hepatic artery was visualized and it was well preserved.  I briefly irrigated the right upper quadrant.  There was no evidence of adhesions to the anterior abdominal wall.  The stomach appeared grossly normal  anteriorly.  I removed the Hasson trocar and tied down the previously placed pursestring suture thus obliterating the fascial defect.  While there was no air leak at the umbilicus, there was a little bit of separation between the fascial edges.  Therefore, I placed another interrupted 0 Vicryl suture and tied this down.  Our closure was viewed laparoscopically.  There was nothing within our closure and this closure felt tighter now.  I injected some additional local in the preperitoneal space at the umbilicus.  The pneumoperitoneum was released and the remaining trocars were removed.  All skin incisions were closed with 4-0 Monocryl in a subcuticular fashion followed by the application of Dermabond.  All needle, instrument and sponge counts were correct x2. There were no immediate complications.  The patient was extubated and taken to the recovery room in stable condition.     Mary Sella. Andrey Campanile, MD     EMW/MEDQ  D:  07/12/2011  T:  07/12/2011  Job:  [id number]  cc:   Vania Rea. Jarold Motto, MD, Caleen Essex, FAGA [name], MD  Electronically  Signed by Gaynelle Adu M.D. on 07/29/2011 12:27:21 PM

## 2011-08-05 ENCOUNTER — Encounter (INDEPENDENT_AMBULATORY_CARE_PROVIDER_SITE_OTHER): Payer: Self-pay | Admitting: General Surgery

## 2011-08-05 ENCOUNTER — Ambulatory Visit (INDEPENDENT_AMBULATORY_CARE_PROVIDER_SITE_OTHER): Payer: BC Managed Care – PPO | Admitting: General Surgery

## 2011-08-05 VITALS — BP 100/64 | HR 64 | Temp 97.4°F | Resp 20 | Ht 63.0 in | Wt 129.1 lb

## 2011-08-05 DIAGNOSIS — Z9049 Acquired absence of other specified parts of digestive tract: Secondary | ICD-10-CM

## 2011-08-05 DIAGNOSIS — Z9889 Other specified postprocedural states: Secondary | ICD-10-CM

## 2011-08-05 DIAGNOSIS — Z09 Encounter for follow-up examination after completed treatment for conditions other than malignant neoplasm: Secondary | ICD-10-CM

## 2011-08-05 NOTE — Patient Instructions (Signed)
Avoid lifting anything >10lbs for 2 weeks  Walking will improve the soreness around the belly button

## 2011-08-05 NOTE — Progress Notes (Signed)
Chief complaint: Postop  Procedure: Status post laparoscopic cholecystectomy with intraoperative cholangiogram July 12, 2011  History of Present Ilness: 25 year old female comes in for her first postoperative appointment. She had had several months of right upper quadrant pain. She has undergone an upper endoscopy, CT scan, and HIDA scan all of which were negative. However since surgery, she states that she no longer has the discomfort in her abdomen that she had before surgery. She denies any fevers or chills. She denies any nausea, vomiting, or diarrhea. She denies any drainage from her incisions. She returned to work last week. She states that she's been having to lift a lot of files at work and that she has been having some soreness at her umbilicus. She is mainly sore when she bends over and lifts files from the floor.  Physical Exam: BP 100/64  Pulse 64  Temp 97.4 F (36.3 C)  Resp 20  Ht 5\' 3"  (1.6 m)  Wt 129 lb 2 oz (58.571 kg)  BMI 22.87 kg/m2  Well-developed well-nourished Caucasian female in no apparent distress Pulmonary-lungs are clear Cardiac-regular rhythm rhythm Abdomen-soft, nontender, nondistended. Well-healed trocar incisions. No signs of cellulitis or induration. No signs of incisional hernia Skin-no jaundice  Pathology: Gallbladder showed mild chronic cholecystitis  Assessment and Plan: Status post laparoscopic cholecystectomy with intraoperative cholangiogram for right upper quadrant pain.  I'm glad that her symptoms have resolved after cholecystectomy.  We discussed her pathology report.  I explained to her that it is not uncommon to have soreness around the umbilicus after surgery. I explained to her that this should improve with time. However we did give her note for work limiting her activity.  I will see her in 4 weeks

## 2011-09-17 ENCOUNTER — Encounter (INDEPENDENT_AMBULATORY_CARE_PROVIDER_SITE_OTHER): Payer: BC Managed Care – PPO | Admitting: General Surgery

## 2011-09-20 ENCOUNTER — Ambulatory Visit (INDEPENDENT_AMBULATORY_CARE_PROVIDER_SITE_OTHER): Payer: BC Managed Care – PPO | Admitting: General Surgery

## 2011-09-20 ENCOUNTER — Encounter (INDEPENDENT_AMBULATORY_CARE_PROVIDER_SITE_OTHER): Payer: Self-pay | Admitting: General Surgery

## 2011-09-20 VITALS — BP 106/66 | HR 70 | Temp 99.6°F | Ht 63.5 in | Wt 132.8 lb

## 2011-09-20 DIAGNOSIS — Z09 Encounter for follow-up examination after completed treatment for conditions other than malignant neoplasm: Secondary | ICD-10-CM

## 2011-09-20 NOTE — Patient Instructions (Signed)
Can resume full activities.  Cover incisions with sunscreen for next 6 months if your skin is directly exposed to sunlight

## 2011-09-20 NOTE — Progress Notes (Signed)
Chief complaint: Postop  Procedure: Status post laparoscopic cholecystectomy with cholangiogram September 24  History of Present Ilness: 25 year old Caucasian female comes in for her second postop appointment. I last saw her on October 18. At that time, she was doing well except for some continued pain in her umbilical incision site. Today, she states that the pain in her bellybutton is much improved. It will only occasionally bother her. It's been more noticeable with repetitive motion or intercourse. It happens infrequently. She denies any fevers, chills, nausea, vomiting, diarrhea, constipation, or right upper quadrant pain.  Physical Exam: BP 106/66  Pulse 70  Temp 99.6 F (37.6 C)  Ht 5' 3.5" (1.613 m)  Wt 132 lb 12.8 oz (60.238 kg)  BMI 23.16 kg/m2  SpO2 98%  Well-developed well-nourished Caucasian female in no apparent distress Pulmonary-lungs are clear Cardiac-regular rate and rhythm Abdomen-soft, nontender, nondistended. Well-healed trocar incisions. No signs of incisional hernia.  data reviewed: I reviewed my last office note  Assessment and Plan: Status post laparoscopic cholecystectomy with interoperative cholangiogram for right upper quadrant pain. Her abdominal pain has resolved. I'm glad that her discomfort at her umbilical incision has greatly improved. I explained that it is not uncommon to have a little bit of persistent intermittent discomfort. There is no signs of hernia at the umbilical site. I reassured her that it will continue to improve. I released her to full activities. She was given information about mederma scar cream. I'll see her on an as needed basis  Mary Sella. Andrey Campanile, MD, FACS General, Bariatric, & Minimally Invasive Surgery [facility], [place]

## 2012-04-19 ENCOUNTER — Ambulatory Visit (INDEPENDENT_AMBULATORY_CARE_PROVIDER_SITE_OTHER): Payer: BC Managed Care – PPO | Admitting: Emergency Medicine

## 2012-04-19 VITALS — BP 115/75 | HR 59 | Temp 98.7°F | Resp 17 | Ht 64.0 in | Wt 131.0 lb

## 2012-04-19 DIAGNOSIS — K648 Other hemorrhoids: Secondary | ICD-10-CM

## 2012-04-19 MED ORDER — HYDROCORTISONE ACETATE 25 MG RE SUPP: 25.0000 mg | Freq: Two times a day (BID) | RECTAL | Status: AC

## 2012-04-19 NOTE — Progress Notes (Signed)
Patient Name: Alicia Jones Date of Birth: Mar 15, 1986 Medical Record Number: [id number] Gender: female Date of Encounter: 04/19/2012  Chief Complaint: Hemorrhoids   History of Present Illness:  Alicia Jones is a 26 y.o. very pleasant female patient who presents with the following:  History of hemorrhoids.  Has hemorrhoid on left that is small and variable in size.  No history of bleeding   Patient Active Problem List  Diagnosis  . MIGRAINE HEADACHE  . ALLERGIC RHINITIS  . DIZZINESS  . SPRAIN&STRAIN OTH SPEC SITES SHOULDER&UPPER ARM  . Diarrhea  . Hematochezia  . Preventative health care  . Headache, common migraine  . Long term current use of non-steroidal anti-inflammatories (NSAID)   Past Medical History  Diagnosis Date  . Acute tonsillitis 09/13/2008  . ALLERGIC RHINITIS 02/07/2008  . MIGRAINE HEADACHE 02/03/2010  . SPRAIN&STRAIN OTH SPEC SITES SHOULDER&UPPER ARM 02/03/2010  . Weight loss, unintentional   . Chest pain   . Nausea and vomiting   . Constipation   . Abdominal pain   . Chest pain   . Diarrhea   . Generalized headaches   . Weakness   . Easy bruising   . Abdominal pain, periumbilical    Past Surgical History  Procedure Date  . Laparoscopic cholecystectomy w/ cholangiography 07/12/11   History  Substance Use Topics  . Smoking status: Never Smoker   . Smokeless tobacco: Never Used  . Alcohol Use: Yes     social   Family History  Problem Relation Age of Onset  . Diabetes Father   . Gallbladder disease Father   . Liver disease Father   . Breast cancer Maternal Grandmother   . Lung cancer Maternal Grandfather   . Colon cancer Neg Hx    No Known Allergies  Medication list has been reviewed and updated.  Current Outpatient Prescriptions on File Prior to Visit  Medication Sig Dispense Refill  . gabapentin (NEURONTIN) 300 MG capsule       . Levonorgestrel-Ethinyl Estradiol (AMETHIA,CAMRESE) 0.1-0.02 & 0.01 MG tablet Take 1 tablet by mouth  daily.       Marland Kitchen topiramate (TOPAMAX) 50 MG tablet 100 mg 2 (two) times daily.      . AMBULATORY NON FORMULARY MEDICATION GI Cocktail 20CC Maalox, 10CC Donatal, 20CC Xylocaine. Swish and swallow as needed  600 mL  0  . cetirizine (ZYRTEC) 10 MG tablet Take 10 mg by mouth as needed.        . gabapentin (NEURONTIN) 300 MG capsule 2 (two) times daily. 1 capsule by mouth twice per day       . HYDROcodone-acetaminophen (VICODIN) 5-500 MG per tablet Take one tablet by mouth every 6 hours for abdominal pain.  20 tablet  0   Current Facility-Administered Medications on File Prior to Visit  Medication Dose Route Frequency Provider Last Rate Last Dose  . 0.9 %  sodium chloride infusion  500 mL Intravenous Continuous Mardella Layman, MD        Review of Systems:  As per HPI, otherwise negative.    Physical Examination: Filed Vitals:   04/19/12 1813  BP: 115/75  Pulse: 59  Temp: 98.7 F (37.1 C)  Resp: 17   Filed Vitals:   04/19/12 1813  Height: 5\' 4"  (1.626 m)  Weight: 131 lb (59.421 kg)   Body mass index is 22.49 kg/(m^2). Ideal Body Weight: Weight in (lb) to have BMI = 25: 145.3   GEN: WDWN, NAD, Non-toxic, A & O x  3 HEENT: Atraumatic, Normocephalic. Neck supple. No masses, No LAD. Ears and Nose: No external deformity. CV: RRR, No M/G/R. No JVD. No thrill. No extra heart sounds. PULM: CTA B, no wheezes, crackles, rhonchi. No retractions. No resp. distress. No accessory muscle use. ABD: S, NT, ND, +BS. No rebound. No HSM. EXTR: No c/c/e NEURO Normal gait.  PSYCH: Normally interactive. Conversant. Not depressed or anxious appearing.  Calm demeanor.  Rectal small internal hemorrhoid  EKG / Labs / Xrays: None available at time of encounter  Assessment and Plan: Anusol miralax  Carmelina Dane, MD

## 2012-04-19 NOTE — Patient Instructions (Addendum)

## 2012-04-19 NOTE — Progress Notes (Signed)
  Subjective:    Patient ID: Alicia Jones, female    DOB: October 23, 1985, 26 y.o.   MRN: [id number]  HPI    Review of Systems     Objective:   Physical Exam        Assessment & Plan:

## 2012-04-25 ENCOUNTER — Telehealth: Payer: Self-pay

## 2012-04-25 DIAGNOSIS — K648 Other hemorrhoids: Secondary | ICD-10-CM

## 2012-04-25 NOTE — Telephone Encounter (Signed)
PT STATES THAT HER HEMORRHOIDS HAS NOT GOTTEN ANY BETTER. PT WOULD LIKE TO KNOW SHOULD SHE TAKE SOMETHING ELSE OTHER THAN THE SUPPOSITORY THAT WAS PRESCRIBED. [phone]

## 2012-04-26 NOTE — Telephone Encounter (Signed)
Dr/ Dareen Piano I'm forwarding this to you as your shift starts in just a few hours. Would you like her to RTC?

## 2012-04-26 NOTE — Telephone Encounter (Signed)
Please call patient and arrange a referral to a colon and rectal surgeon for her hemorrhoids.  Thanks.

## 2012-04-26 NOTE — Telephone Encounter (Signed)
Pt agreed to see surgeon.  Referral made to [facility] for hemorrhoid

## 2012-05-05 ENCOUNTER — Encounter (INDEPENDENT_AMBULATORY_CARE_PROVIDER_SITE_OTHER): Payer: Self-pay | Admitting: General Surgery

## 2012-05-05 ENCOUNTER — Ambulatory Visit (INDEPENDENT_AMBULATORY_CARE_PROVIDER_SITE_OTHER): Payer: BC Managed Care – PPO | Admitting: General Surgery

## 2012-05-05 VITALS — BP 102/64 | HR 71 | Temp 97.6°F | Ht 63.0 in | Wt 130.6 lb

## 2012-05-05 DIAGNOSIS — K649 Unspecified hemorrhoids: Secondary | ICD-10-CM

## 2012-05-05 DIAGNOSIS — K648 Other hemorrhoids: Secondary | ICD-10-CM

## 2012-05-05 MED ORDER — DOCUSATE SODIUM 100 MG PO CAPS: 100.0000 mg | ORAL_CAPSULE | Freq: Two times a day (BID) | ORAL | Status: AC

## 2012-05-05 NOTE — Patient Instructions (Signed)
Increase water intake. Increase fiber intake to 20-30 grams of fiber daily

## 2012-05-05 NOTE — Progress Notes (Signed)
Patient ID: Alicia Jones, female   DOB: 1986-09-03, 26 y.o.   MRN: [id number]  Chief Complaint  Patient presents with  . Pre-op Exam    eval hems  . Rectal Problems    HPI Alicia Jones is a 27 y.o. female.  This patient was referred from the urgent care facility for evaluation of persistent hemorrhoids. She says that she normally has loose bowel movements or normal bowel movement but about 5-6 weeks ago she began having some constipation and at about the same time she noticed a bulge at her anus  anal fissure since then she has been having some discomfort as well. She says that she went to the urgent care and she was given some hydrocortisone suppositories and she has also tried Preparation H gel and cream and she has not had any relief with these medications. She has been drinking and reduce and taking MiraLax daily which has helped and she has had some fiber gummies as well but she still notices a bulge in the anal region and some discomfort. She says over the last few weeks it has been increasing in size as well. She denies any blood in the stools or black stools or any blood on the tissue HPI  Past Medical History  Diagnosis Date  . Acute tonsillitis 09/13/2008  . ALLERGIC RHINITIS 02/07/2008  . MIGRAINE HEADACHE 02/03/2010  . SPRAIN&STRAIN OTH SPEC SITES SHOULDER&UPPER ARM 02/03/2010  . Weight loss, unintentional   . Chest pain   . Nausea and vomiting   . Constipation   . Abdominal pain   . Chest pain   . Diarrhea   . Generalized headaches   . Weakness   . Easy bruising   . Abdominal pain, periumbilical     Past Surgical History  Procedure Date  . Laparoscopic cholecystectomy w/ cholangiography 07/12/11  . Cholecystectomy 07/2011    Family History  Problem Relation Age of Onset  . Diabetes Father   . Gallbladder disease Father   . Liver disease Father   . Breast cancer Maternal Grandmother   . Cancer Maternal Grandmother     breast  . Lung cancer Maternal Grandfather    . Colon cancer Neg Hx   . Cancer Paternal Grandmother     breast  . Cancer Paternal Grandfather     lung    Social History History  Substance Use Topics  . Smoking status: Never Smoker   . Smokeless tobacco: Never Used  . Alcohol Use: Yes     social    No Known Allergies  Current Outpatient Prescriptions  Medication Sig Dispense Refill  . AMBULATORY NON FORMULARY MEDICATION GI Cocktail 20CC Maalox, 10CC Donatal, 20CC Xylocaine. Swish and swallow as needed  600 mL  0  . cetirizine (ZYRTEC) 10 MG tablet Take 10 mg by mouth as needed.        . gabapentin (NEURONTIN) 300 MG capsule Take 300 mg by mouth 2 (two) times daily.       . Levonorgestrel-Ethinyl Estradiol (AMETHIA,CAMRESE) 0.1-0.02 & 0.01 MG tablet Take 1 tablet by mouth daily.       Marland Kitchen topiramate (TOPAMAX) 50 MG tablet 150 mg daily.       Marland Kitchen gabapentin (NEURONTIN) 300 MG capsule 2 (two) times daily. 1 capsule by mouth twice per day       . DISCONTD: cetirizine (ZYRTEC) 10 MG tablet Take 10 mg by mouth daily.       Current Facility-Administered Medications  Medication Dose Route  Frequency Provider Last Rate Last Dose  . 0.9 %  sodium chloride infusion  500 mL Intravenous Continuous Mardella Layman, MD        Review of Systems Review of Systems All other review of systems negative or noncontributory except as stated in the HPI  Blood pressure 102/64, pulse 71, temperature 97.6 F (36.4 C), temperature source Temporal, height 5\' 3"  (1.6 m), weight 130 lb 9.6 oz (59.24 kg), last menstrual period 01/19/2012, SpO2 99.00%.  Physical Exam Physical Exam Physical Exam  Nursing note and vitals reviewed. Constitutional: She is oriented to person, place, and time. She appears well-developed and well-nourished. No distress.  HENT:  Head: Normocephalic and atraumatic.  Mouth/Throat: No oropharyngeal exudate.  Eyes: Conjunctivae and EOM are normal. Pupils are equal, round, and reactive to light. Right eye exhibits no  discharge. Left eye exhibits no discharge. No scleral icterus.  Neck: Normal range of motion. Neck supple. No tracheal deviation present.  Cardiovascular: Normal rate, regular rhythm, normal heart sounds and intact distal pulses.   Pulmonary/Chest: Effort normal and breath sounds normal. No stridor. No respiratory distress. She has no wheezes.  Abdominal: Soft. Bowel sounds are normal. She exhibits no distension and no mass. There is no tenderness. There is no rebound and no guarding.  Musculoskeletal: Normal range of motion. She exhibits no edema and no tenderness.  Neurological: She is alert and oriented to person, place, and time.  Skin: Skin is warm and dry. No rash noted. She is not diaphoretic. No erythema. No pallor.  Psychiatric: She has a normal mood and affect. Her behavior is normal. Judgment and thought content normal.  Rectal: She had some excess skin immediately posterior, and I had her look at the area with a mirror and point out the area of concern,  The area that she points to is this excess skin but I did not see any protruding hemorrhoids on either left or the right. Anoscopic exam demonstrates some internal hemorrhoids no but they do not appear thrombosed and there is no evidence of bleeding. No internal masses.  Data Reviewed   Assessment    Anal pain and internal hemorrhoids She has some excess skin immediately posteriorly and some internal hemorrhoids without evidence of bleeding or thrombosis. I did not see any obvious bulge externally. We had a long discussion about the conservative treatment for hemorrhoids and discussed surgical treatment as well. She has already done several nonoperative treatment and has not seen much improvement. I discussed with her the recommendations for increasing water intake and 420-30 g of fiber daily I also prescribed some stool softeners as well. I have recommended that she increase her fiber intake and water intake and tried stool softeners  and attention to the area of bulging and pain after bowel movements to see if this area of excess skin posteriorly is the area of actual concern over that area. If she is not better in a few weeks, and she was seen back in about 4 weeks and we will discuss possible rectal exam under anesthesia and possible hemorrhoidectomy    Plan    Increase water intake and fiber intake Stool softeners Followup in 4 weeks if her symptoms have not improved       Alicia Jones DAVID 05/05/2012, 10:07 AM

## 2012-05-25 ENCOUNTER — Encounter (INDEPENDENT_AMBULATORY_CARE_PROVIDER_SITE_OTHER): Payer: BC Managed Care – PPO | Admitting: General Surgery

## 2012-06-07 ENCOUNTER — Encounter (INDEPENDENT_AMBULATORY_CARE_PROVIDER_SITE_OTHER): Payer: BC Managed Care – PPO | Admitting: General Surgery

## 2012-08-10 ENCOUNTER — Ambulatory Visit (INDEPENDENT_AMBULATORY_CARE_PROVIDER_SITE_OTHER): Payer: Self-pay | Admitting: Family Medicine

## 2012-08-10 VITALS — BP 119/63 | HR 60 | Temp 98.5°F | Resp 16 | Ht 63.5 in | Wt 129.6 lb

## 2012-08-10 DIAGNOSIS — H9202 Otalgia, left ear: Secondary | ICD-10-CM

## 2012-08-10 DIAGNOSIS — H9209 Otalgia, unspecified ear: Secondary | ICD-10-CM

## 2012-08-10 MED ORDER — MUPIROCIN CALCIUM 2 % EX CREA: TOPICAL_CREAM | Freq: Three times a day (TID) | CUTANEOUS | Status: DC

## 2012-08-10 NOTE — Progress Notes (Signed)
[facility]:  Office Visit  Chief Complaint:  Chief Complaint  Patient presents with  . Otalgia    feel like a bump in L ear, very sore x 5 days  . Nasal Congestion    x 3 days    HPI: Alicia Jones is a 26 y.o. female who complains of  Left ear bump, painful x 3 days. Denies fevers, chills, drainage or inner ear pain. Denies swimming. Prior h/o of internal ear bump requiring drainage and infection. No txt.  + nasal congestion x 1 day, sore throat, no other sxs  Past Medical History  Diagnosis Date  . Acute tonsillitis 09/13/2008  . ALLERGIC RHINITIS 02/07/2008  . MIGRAINE HEADACHE 02/03/2010  . SPRAIN&STRAIN OTH SPEC SITES SHOULDER&UPPER ARM 02/03/2010  . Weight loss, unintentional   . Chest pain   . Nausea and vomiting   . Constipation   . Abdominal pain   . Chest pain   . Diarrhea   . Generalized headaches   . Weakness   . Easy bruising   . Abdominal pain, periumbilical    Past Surgical History  Procedure Date  . Laparoscopic cholecystectomy w/ cholangiography 07/12/11  . Cholecystectomy 07/2011   History   Social History  . Marital Status: Married    Spouse Name: N/A    Number of Children: 0  . Years of Education: N/A   Occupational History  . 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     Social History Main Topics  . Smoking status: Never Smoker   . Smokeless tobacco: Never Used  . Alcohol Use: Yes     social  . Drug Use: No  . Sexually Active: Yes    Birth Control/ Protection: Pill   Other Topics Concern  . None   Social History Narrative  . None   Family History  Problem Relation Age of Onset  . Diabetes Father   . Gallbladder disease Father   . Liver disease Father   . Breast cancer Maternal Grandmother   . Cancer Maternal Grandmother     breast  . Lung cancer Maternal Grandfather   . Colon cancer Neg Hx   . Cancer Paternal Grandmother     breast  . Cancer Paternal Grandfather     lung   No Known Allergies Prior to  Admission medications   Medication Sig Start Date End Date Taking? Authorizing Provider  cetirizine (ZYRTEC) 10 MG tablet Take 10 mg by mouth as needed.     Yes Historical Provider, MD  Levonorgestrel-Ethinyl Estradiol (AMETHIA,CAMRESE) 0.1-0.02 & 0.01 MG tablet Take 1 tablet by mouth daily.    Yes Historical Provider, MD  propranolol (INDERAL) 80 MG tablet Take 80 mg by mouth 3 (three) times daily.   Yes Historical Provider, MD  topiramate (TOPAMAX) 50 MG tablet 150 mg daily.  05/04/11  Yes Historical Provider, MD  AMBULATORY NON FORMULARY MEDICATION GI Cocktail 20CC Maalox, 10CC Donatal, 20CC Xylocaine. Swish and swallow as needed 05/21/11   Mardella Layman, MD  gabapentin (NEURONTIN) 300 MG capsule 2 (two) times daily. 1 capsule by mouth twice per day  05/20/11 07/07/11  [name], MD  gabapentin (NEURONTIN) 300 MG capsule Take 300 mg by mouth 2 (two) times daily.  05/13/11   Historical Provider, MD     ROS: The patient denies fevers, chills, night sweats, unintentional weight loss, chest pain, palpitations, wheezing, dyspnea on exertion, nausea, vomiting, abdominal pain, dysuria, hematuria, melena, numbness, weakness, or tingling.  All other systems have been reviewed and were otherwise negative with the exception of those mentioned in the HPI and as above.    PHYSICAL EXAM: Filed Vitals:   08/10/12 1608  BP: 119/63  Pulse: 60  Temp: 98.5 F (36.9 C)  Resp: 16   Filed Vitals:   08/10/12 1608  Height: 5' 3.5" (1.613 m)  Weight: 129 lb 9.6 oz (58.786 kg)   Body mass index is 22.60 kg/(m^2).  General: Alert, no acute distress HEENT:  Normocephalic, atraumatic, oropharynx patent. TM nl, no exudates, no tonsilla swelling. Left ear fossa, little papule feels acne like in origin on palpation. It is tucked unde the ear fold of the fossa so I cannot visualize it very well. No drainage , no pus, no erythema, no warmth.   Cardiovascular:  Regular rate and rhythm, no rubs murmurs or  gallops.  No Carotid bruits, radial pulse intact. No pedal edema.  Respiratory: Clear to auscultation bilaterally.  No wheezes, rales, or rhonchi.  No cyanosis, no use of accessory musculature GI: No organomegaly, abdomen is soft and non-tender, positive bowel sounds.  No masses. Skin: No rashes. Neurologic: Facial musculature symmetric. Psychiatric: Patient is appropriate throughout our interaction. Lymphatic: No cervical lymphadenopathy Musculoskeletal: Gait intact.   LABS: Results for orders placed during the hospital encounter of 07/09/11  CBC      Component Value Range   WBC 4.9  4.0 - 10.5 K/uL   RBC 3.94  3.87 - 5.11 MIL/uL   Hemoglobin 13.1  12.0 - 15.0 g/dL   HCT 62.9 (*) 52.8 - 41.3 %   MCV 90.6  78.0 - 100.0 fL   MCH 33.2  26.0 - 34.0 pg   MCHC 36.7 (*) 30.0 - 36.0 g/dL   RDW 24.4  01.0 - 27.2 %   Platelets 240  150 - 400 K/uL  HCG, SERUM, QUALITATIVE      Component Value Range   Preg, Serum NEGATIVE  NEGATIVE  SURGICAL PCR SCREEN      Component Value Range   MRSA, PCR NEGATIVE  NEGATIVE   Staphylococcus aureus NEGATIVE  NEGATIVE     EKG/XRAY:   Primary read interpreted by Dr. Conley Rolls at [facility].   ASSESSMENT/PLAN: Encounter Diagnosis  Name Primary?  . Ear pain, left Yes   Will give bactroban cream TID for prevention infection. Avoid touching area.  Nasal congestion recommend otc treatment since only 1 day with h/o allergic rhinitis F/u prn.    LE, THAO PHUONG, DO 08/10/2012 4:47 PM

## 2013-05-22 ENCOUNTER — Ambulatory Visit (INDEPENDENT_AMBULATORY_CARE_PROVIDER_SITE_OTHER): Payer: 59 | Admitting: Internal Medicine

## 2013-05-22 ENCOUNTER — Encounter: Payer: Self-pay | Admitting: Internal Medicine

## 2013-05-22 VITALS — BP 102/62 | HR 58 | Temp 98.0°F | Wt 137.2 lb

## 2013-05-22 DIAGNOSIS — R197 Diarrhea, unspecified: Secondary | ICD-10-CM

## 2013-05-22 DIAGNOSIS — K529 Noninfective gastroenteritis and colitis, unspecified: Secondary | ICD-10-CM

## 2013-05-22 NOTE — Patient Instructions (Signed)
Diarrhea Diarrhea is frequent loose and watery bowel movements. It can cause you to feel weak and dehydrated. Dehydration can cause you to become tired and thirsty, have a dry mouth, and have decreased urination that often is dark yellow. Diarrhea is a sign of another problem, most often an infection that will not last long. In most cases, diarrhea typically lasts 2 3 days. However, it can last longer if it is a sign of something more serious. It is important to treat your diarrhea as directed by your caregive to lessen or prevent future episodes of diarrhea. CAUSES  Some common causes include:  Gastrointestinal infections caused by viruses, bacteria, or parasites.  Food poisoning or food allergies.  Certain medicines, such as antibiotics, chemotherapy, and laxatives.  Artificial sweeteners and fructose.  Digestive disorders. HOME CARE INSTRUCTIONS  Ensure adequate fluid intake (hydration): have 1 cup (8 oz) of fluid for each diarrhea episode. Avoid fluids that contain simple sugars or sports drinks, fruit juices, whole milk products, and sodas. Your urine should be clear or pale yellow if you are drinking enough fluids. Hydrate with an oral rehydration solution that you can purchase at pharmacies, retail stores, and online. You can prepare an oral rehydration solution at home by mixing the following ingredients together:    tsp table salt.   tsp baking soda.   tsp salt substitute containing potassium chloride.  1  tablespoons sugar.  1 L (34 oz) of water.  Certain foods and beverages may increase the speed at which food moves through the gastrointestinal (GI) tract. These foods and beverages should be avoided and include:  Caffeinated and alcoholic beverages.  High-fiber foods, such as raw fruits and vegetables, nuts, seeds, and whole grain breads and cereals.  Foods and beverages sweetened with sugar alcohols, such as xylitol, sorbitol, and mannitol.  Some foods may be well  tolerated and may help thicken stool including:  Starchy foods, such as rice, toast, pasta, low-sugar cereal, oatmeal, grits, baked potatoes, crackers, and bagels.  Bananas.  Applesauce.  Add probiotic-rich foods to help increase healthy bacteria in the GI tract, such as yogurt and fermented milk products.  Wash your hands well after each diarrhea episode.  Only take over-the-counter or prescription medicines as directed by your caregiver.  Take a warm bath to relieve any burning or pain from frequent diarrhea episodes. SEEK IMMEDIATE MEDICAL CARE IF:   You are unable to keep fluids down.  You have persistent vomiting.  You have blood in your stool, or your stools are black and tarry.  You do not urinate in 6 8 hours, or there is only a small amount of very dark urine.  You have abdominal pain that increases or localizes.  You have weakness, dizziness, confusion, or lightheadedness.  You have a severe headache.  Your diarrhea gets worse or does not get better.  You have a fever or persistent symptoms for more than 2 3 days.  You have a fever and your symptoms suddenly get worse. MAKE SURE YOU:   Understand these instructions.  Will watch your condition.  Will get help right away if you are not doing well or get worse. Document Released: 09/24/2002 Document Revised: 09/20/2012 Document Reviewed: 06/11/2012 [employer] Patient Information 2014 [employer]. Diet for Diarrhea, Adult Frequent, runny stools (diarrhea) may be caused or worsened by food or drink. Diarrhea may be relieved by changing your diet. Since diarrhea can last up to 7 days, it is easy for you to lose too much fluid   from the body and become dehydrated. Fluids that are lost need to be replaced. Along with a modified diet, make sure you drink enough fluids to keep your urine clear or pale yellow. DIET INSTRUCTIONS  Ensure adequate fluid intake (hydration): have 1 cup (8 oz) of fluid for each diarrhea  episode. Avoid fluids that contain simple sugars or sports drinks, fruit juices, whole milk products, and sodas. Your urine should be clear or pale yellow if you are drinking enough fluids. Hydrate with an oral rehydration solution that you can purchase at pharmacies, retail stores, and online. You can prepare an oral rehydration solution at home by mixing the following ingredients together:    tsp table salt.   tsp baking soda.   tsp salt substitute containing potassium chloride.  1  tablespoons sugar.  1 L (34 oz) of water.  Certain foods and beverages may increase the speed at which food moves through the gastrointestinal (GI) tract. These foods and beverages should be avoided and include:  Caffeinated and alcoholic beverages.  High-fiber foods, such as raw fruits and vegetables, nuts, seeds, and whole grain breads and cereals.  Foods and beverages sweetened with sugar alcohols, such as xylitol, sorbitol, and mannitol.  Some foods may be well tolerated and may help thicken stool including:  Starchy foods, such as rice, toast, pasta, low-sugar cereal, oatmeal, grits, baked potatoes, crackers, and bagels.   Bananas.   Applesauce.  Add probiotic-rich foods to help increase healthy bacteria in the GI tract, such as yogurt and fermented milk products. RECOMMENDED FOODS AND BEVERAGES Starches Choose foods with less than 2 g of fiber per serving.  Recommended:  White, French, and pita breads, plain rolls, buns, bagels. Plain muffins, matzo. Soda, saltine, or graham crackers. Pretzels, melba toast, zwieback. Cooked cereals made with water: cornmeal, farina, cream cereals. Dry cereals: refined corn, wheat, rice. Potatoes prepared any way without skins, refined macaroni, spaghetti, noodles, refined rice.  Avoid:  Bread, rolls, or crackers made with whole wheat, multi-grains, rye, bran seeds, nuts, or coconut. Corn tortillas or taco shells. Cereals containing whole grains, multi-grains,  bran, coconut, nuts, raisins. Cooked or dry oatmeal. Coarse wheat cereals, granola. Cereals advertised as "high-fiber." Potato skins. Whole grain pasta, wild or brown rice. Popcorn. Sweet potatoes, yams. Sweet rolls, doughnuts, waffles, pancakes, sweet breads. Vegetables  Recommended: Strained tomato and vegetable juices. Most well-cooked and canned vegetables without seeds. Fresh: Tender lettuce, cucumber without the skin, cabbage, spinach, bean sprouts.  Avoid: Fresh, cooked, or canned: Artichokes, baked beans, beet greens, broccoli, Brussels sprouts, corn, kale, legumes, peas, sweet potatoes. Cooked: Green or red cabbage, spinach. Avoid large servings of any vegetables because vegetables shrink when cooked, and they contain more fiber per serving than fresh vegetables. Fruit  Recommended: Cooked or canned: Apricots, applesauce, cantaloupe, cherries, fruit cocktail, grapefruit, grapes, kiwi, mandarin oranges, peaches, pears, plums, watermelon. Fresh: Apples without skin, ripe banana, grapes, cantaloupe, cherries, grapefruit, peaches, oranges, plums. Keep servings limited to  cup or 1 piece.  Avoid: Fresh: Apples with skin, apricots, mangoes, pears, raspberries, strawberries. Prune juice, stewed or dried prunes. Dried fruits, raisins, dates. Large servings of all fresh fruits. Protein  Recommended: Ground or well-cooked tender beef, ham, veal, lamb, pork, or poultry. Eggs. Fish, oysters, shrimp, lobster, other seafoods. Liver, organ meats.  Avoid: Tough, fibrous meats with gristle. Peanut butter, smooth or chunky. Cheese, nuts, seeds, legumes, dried peas, beans, lentils. Dairy  Recommended: Yogurt, lactose-free milk, kefir, drinkable yogurt, buttermilk, soy milk, or plain hard cheese.    Avoid: Milk, chocolate milk, beverages made with milk, such as milkshakes. Soups  Recommended: Bouillon, broth, or soups made from allowed foods. Any strained soup.  Avoid: Soups made from vegetables that  are not allowed, cream or milk-based soups. Desserts and Sweets  Recommended: Sugar-free gelatin, sugar-free frozen ice pops made without sugar alcohol.  Avoid: Plain cakes and cookies, pie made with fruit, pudding, custard, cream pie. Gelatin, fruit, ice, sherbet, frozen ice pops. Ice cream, ice milk without nuts. Plain hard candy, honey, jelly, molasses, syrup, sugar, chocolate syrup, gumdrops, marshmallows. Fats and Oils  Recommended: Limit fats to less than 8 tsp per day.  Avoid: Seeds, nuts, olives, avocados. Margarine, butter, cream, mayonnaise, salad oils, plain salad dressings. Plain gravy, crisp bacon without rind. Beverages  Recommended: Water, decaffeinated teas, oral rehydration solutions, sugar-free beverages not sweetened with sugar alcohols.  Avoid: Fruit juices, caffeinated beverages (coffee, tea, soda), alcohol, sports drinks, or lemon-lime soda. Condiments  Recommended: Ketchup, mustard, horseradish, vinegar, cocoa powder. Spices in moderation: allspice, basil, bay leaves, celery powder or leaves, cinnamon, cumin powder, curry powder, ginger, mace, marjoram, onion or garlic powder, oregano, paprika, parsley flakes, ground pepper, rosemary, sage, savory, tarragon, thyme, turmeric.  Avoid: Coconut, honey. Document Released: 12/25/2003 Document Revised: 06/28/2012 Document Reviewed: 02/18/2012 [employer] Patient Information 2014 [employer].  

## 2013-05-22 NOTE — Progress Notes (Signed)
Subjective:    Patient ID: Alicia Jones, female    DOB: October 16, 1986, 27 y.o.   MRN: [id number]  HPI  Pt presents to the clinic today with c/o diarrhea. This started roughly 2 years ago after having her gallbladder taken out. She typically has diarrhea every other day. She was told her GI issues would take up to a year to regulate, but it has been 2 years and they haven't. Anything she eats can make her have diarrhea. There is some associated abdominal cramping and no nausea. She occasionally has some BRB on the toilet paper when she wipes but she does have hemorrhoids.  She also get diarrhea a lot when she is nervous. She is not sure if she has IBS or not. She has no family history of IBS but there is ulcerative colitis on her mothers side.  Review of Systems      Past Medical History  Diagnosis Date  . Acute tonsillitis 09/13/2008  . ALLERGIC RHINITIS 02/07/2008  . MIGRAINE HEADACHE 02/03/2010  . SPRAIN&STRAIN OTH SPEC SITES SHOULDER&UPPER ARM 02/03/2010  . Weight loss, unintentional   . Chest pain   . Nausea and vomiting   . Constipation   . Abdominal pain   . Chest pain   . Diarrhea   . Generalized headaches   . Weakness   . Easy bruising   . Abdominal pain, periumbilical     Current Outpatient Prescriptions  Medication Sig Dispense Refill  . AMBULATORY NON FORMULARY MEDICATION GI Cocktail 20CC Maalox, 10CC Donatal, 20CC Xylocaine. Swish and swallow as needed  600 mL  0  . cetirizine (ZYRTEC) 10 MG tablet Take 10 mg by mouth as needed.        . gabapentin (NEURONTIN) 300 MG capsule 2 (two) times daily. 1 capsule by mouth twice per day       . gabapentin (NEURONTIN) 300 MG capsule Take 300 mg by mouth 2 (two) times daily.       . Levonorgestrel-Ethinyl Estradiol (AMETHIA,CAMRESE) 0.1-0.02 & 0.01 MG tablet Take 1 tablet by mouth daily.       . mupirocin cream (BACTROBAN) 2 % Apply topically 3 (three) times daily.  15 g  0  . propranolol (INDERAL) 80 MG tablet Take 80 mg by  mouth 3 (three) times daily.      Marland Kitchen topiramate (TOPAMAX) 50 MG tablet 150 mg daily.        Current Facility-Administered Medications  Medication Dose Route Frequency Provider Last Rate Last Dose  . 0.9 %  sodium chloride infusion  500 mL Intravenous Continuous Mardella Layman, MD        No Known Allergies  Family History  Problem Relation Age of Onset  . Diabetes Father   . Gallbladder disease Father   . Liver disease Father   . Breast cancer Maternal Grandmother   . Cancer Maternal Grandmother     breast  . Lung cancer Maternal Grandfather   . Colon cancer Neg Hx   . Cancer Paternal Grandmother     breast  . Cancer Paternal Grandfather     lung    History   Social History  . Marital Status: Married    Spouse Name: N/A    Number of Children: 0  . Years of Education: N/A   Occupational History  . 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     Social History Main Topics  . Smoking status: Never Smoker   . Smokeless tobacco: Never Used  .  Alcohol Use: Yes     Comment: social  . Drug Use: No  . Sexually Active: Yes    Birth Control/ Protection: Pill   Other Topics Concern  . Not on file   Social History Narrative  . No narrative on file     Constitutional: Denies fever, malaise, fatigue, headache or abrupt weight changes.   Gastrointestinal: Pt reports diarrhea. Denies abdominal pain, bloating, constipation, or blood in the stool.    No other specific complaints in a complete review of systems (except as listed in HPI above).  Objective:   Physical Exam   BP 102/62  Pulse 58  Temp(Src) 98 F (36.7 C) (Oral)  Wt 137 lb 3.2 oz (62.234 kg)  BMI 23.92 kg/m2  SpO2 97% Wt Readings from Last 3 Encounters:  05/22/13 137 lb 3.2 oz (62.234 kg)  08/10/12 129 lb 9.6 oz (58.786 kg)  05/05/12 130 lb 9.6 oz (59.24 kg)    General: Appears her stated age, well developed, well nourished in NAD. Cardiovascular: Normal rate and rhythm. S1,S2 noted.  No murmur, rubs  or gallops noted. No JVD or BLE edema. No carotid bruits noted. Pulmonary/Chest: Normal effort and positive vesicular breath sounds. No respiratory distress. No wheezes, rales or ronchi noted.  Abdomen: Soft and nontender. Normal bowel sounds, no bruits noted. No distention or masses noted. Liver, spleen and kidneys non palpable.   BMET    Component Value Date/Time   NA 138 05/20/2011 1315   K 3.3* 05/20/2011 1315   CL 110 05/20/2011 1315   CO2 18* 05/20/2011 1315   GLUCOSE 71 05/20/2011 1315   BUN 10 05/20/2011 1315   CREATININE 0.8 05/20/2011 1315   CALCIUM 8.4 05/20/2011 1315   GFRNONAA 113 02/07/2008 0000   GFRAA 137 02/07/2008 0000    Lipid Panel  No results found for this basename: chol, trig, hdl, cholhdl, vldl, ldlcalc    CBC    Component Value Date/Time   WBC 4.9 07/09/2011 1511   RBC 3.94 07/09/2011 1511   HGB 13.1 07/09/2011 1511   HCT 35.7* 07/09/2011 1511   PLT 240 07/09/2011 1511   MCV 90.6 07/09/2011 1511   MCH 33.2 07/09/2011 1511   MCHC 36.7* 07/09/2011 1511   RDW 11.8 07/09/2011 1511   LYMPHSABS 1.6 05/20/2011 1315   MONOABS 0.2 05/20/2011 1315   EOSABS 0.0 05/20/2011 1315   BASOSABS 0.0 05/20/2011 1315    Hgb A1C No results found for this basename: HGBA1C        Assessment & Plan:   Chronic diarrhea, ? Related to gallbladder surgery versus IBS:  Ok to take imodium OTC for diarrhea She has seen Dr Jarold Motto in the past Call and make a f/u appointment with Dr. Jarold Motto  RTC as needed or if symptoms persist or worsen

## 2013-06-25 ENCOUNTER — Ambulatory Visit (INDEPENDENT_AMBULATORY_CARE_PROVIDER_SITE_OTHER): Payer: 59 | Admitting: Diagnostic Neuroimaging

## 2013-06-25 ENCOUNTER — Encounter: Payer: Self-pay | Admitting: Diagnostic Neuroimaging

## 2013-06-25 VITALS — BP 95/60 | HR 63 | Temp 98.7°F | Ht 63.0 in | Wt 138.0 lb

## 2013-06-25 DIAGNOSIS — G43909 Migraine, unspecified, not intractable, without status migrainosus: Secondary | ICD-10-CM

## 2013-06-25 MED ORDER — PROPRANOLOL HCL 20 MG PO TABS: 20.0000 mg | ORAL_TABLET | Freq: Two times a day (BID) | ORAL | Status: DC

## 2013-06-25 NOTE — Patient Instructions (Signed)
Reduce propranolol to 20mg  twice a day.  Keep blood pressure log.

## 2013-06-25 NOTE — Progress Notes (Signed)
[facility]  PATIENT: Alicia Jones DOB: [birth year]  REFERRING CLINICIAN:  HISTORY FROM: patient REASON FOR VISIT: follow up   HISTORICAL  CHIEF COMPLAINT:  Chief Complaint  Patient presents with  . Migraine    follow up    HISTORY OF PRESENT ILLNESS:   UPDATE 06/25/13: Doing well. Having 1 HA every 10 days. Now on propranolol 40mg  BID + TPX 50mg  BID. Uses advil prn HA. Asking about family planning / conception and medication side effects.   UPDATE 11/14/12: Since last visit, tapered off gabapentin, now on propranolol 80mg  qhs. Lower TPX (50mg  BID). Satisfied with current regimen. No lightheadedness or dizziness. Uses advil migraine prn. 2-3 HA per month (3-4 days per HA).  UPDATE 03/23/12:  Returns for follow up since last visit.  Continues on Topirimate 100mg  BID which is causing tingling in her fingers and focus problems and gabapentin 600mg  BID tolerating well.  She has had 4 days severe migraines per month and a  dull headache 2-3 days per week.  Notices worse with increased stress, increased sleep and alcohol beverages intake.  Takes an occasional Advil.     PRIOR HPI: 27 year old female with history of migraine headaches, presenting for evaluation and management of intractable headaches.  Patient reports history of migraine headaches in the past 2 years. She reports bifrontal, occipital, severe headaches with squeezing pressure associated with dizziness, nausea, photophobia and phonophobia. She has 1-2 severe headaches per week; she has a low-grade daily headache as well.  She has increased headaches with her menstrual cycle and with sleep deprivation. She denies any food, caffeine or other triggers.  She has been evaluated at the [facility] clinic tried a variety of treatments including Depakote 1000 mg a day, verapamil 240 mg daily, Topamax 150 mg a day, chlorpromazine, sumatriptan, naproxen, trigger point injections and Botox.  Patient thinks that  Topamax seemed to help the most.  Over the past several weeks she is weaning herself off of most of these medications and currently is taking Topamax 50 mg per day and Excedrin Migraine as needed for breakthrough headaches. She has tried sumatriptan and naproxen combination 3 times for breakthrough headache without significant relief.  REVIEW OF SYSTEMS: Full 14 system review of systems performed and notable only for HA, memory loss confusion numbness weakness, anxiety none of sleep racing thoughts, diarrhea constipation feeling had increased thirst, weight gain fatigue itching.  ALLERGIES: No Known Allergies  HOME MEDICATIONS:  Outpatient Prescriptions Prior to Visit  Medication Sig Dispense Refill  . Levonorgestrel-Ethinyl Estradiol (AMETHIA,CAMRESE) 0.1-0.02 & 0.01 MG tablet Take 1 tablet by mouth daily.       Marland Kitchen topiramate (TOPAMAX) 50 MG tablet Take 50 mg by mouth 2 (two) times daily.       . propranolol (INDERAL) 40 MG tablet Take 40 mg by mouth 2 (two) times daily.       No facility-administered medications prior to visit.    PAST MEDICAL HISTORY: Past Medical History  Diagnosis Date  . Acute tonsillitis 09/13/2008  . ALLERGIC RHINITIS 02/07/2008  . MIGRAINE HEADACHE 02/03/2010  . SPRAIN&STRAIN OTH SPEC SITES SHOULDER&UPPER ARM 02/03/2010  . Weight loss, unintentional   . Chest pain   . Nausea and vomiting   . Constipation   . Abdominal pain   . Chest pain   . Diarrhea   . Generalized headaches   . Weakness   . Easy bruising   . Abdominal pain, periumbilical     PAST SURGICAL HISTORY:  Past Surgical History  Procedure Laterality Date  . Laparoscopic cholecystectomy w/ cholangiography  07/12/11  . Cholecystectomy  07/2011    FAMILY HISTORY: Family History  Problem Relation Age of Onset  . Diabetes Father   . Gallbladder disease Father   . Liver disease Father   . Breast cancer Maternal Grandmother   . Cancer Maternal Grandmother     breast  . Lung cancer Maternal  Grandfather   . Colon cancer Neg Hx   . Cancer Paternal Grandmother     breast  . Cancer Paternal Grandfather     lung    SOCIAL HISTORY:  History   Social History  . Marital Status: Married    Spouse Name: N/A    Number of Children: 0  . Years of Education: College   Occupational History  . 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 [employer]    Social History Main Topics  . Smoking status: Never Smoker   . Smokeless tobacco: Never Used  . Alcohol Use: Yes     Comment: social  . Drug Use: No  . Sexual Activity: Yes    Birth Control/ Protection: Pill   Other Topics Concern  . Not on file   Social History Narrative   Patient lives at home with her spouse.    Caffeine Use: consumes every other day     PHYSICAL EXAM  Filed Vitals:   06/25/13 1423  BP: 95/60  Pulse: 63  Temp: 98.7 F (37.1 C)  TempSrc: Oral  Height: 5\' 3"  (1.6 m)  Weight: 138 lb (62.596 kg)    Not recorded    Body mass index is 24.45 kg/(m^2).  GENERAL EXAM: Patient is in no distress  CARDIOVASCULAR: Regular rate and rhythm, no murmurs, no carotid bruits  NEUROLOGIC: MENTAL STATUS: awake, alert, language fluent, comprehension intact, naming intact CRANIAL NERVE: pupils equal and reactive to light, visual fields full to confrontation, extraocular muscles intact, no nystagmus, facial sensation and strength symmetric, uvula midline, shoulder shrug symmetric, tongue midline. MOTOR: normal bulk and tone, full strength in the BUE, BLE SENSORY: normal and symmetric to light touch COORDINATION: finger-nose-finger, fine finger movements normal REFLEXES: deep tendon reflexes present and symmetric GAIT/STATION: narrow based gait   DIAGNOSTIC DATA (LABS, IMAGING, TESTING) - I reviewed patient records, labs, notes, testing and imaging myself where available.  Lab Results  Component Value Date   WBC 4.9 07/09/2011   HGB 13.1 07/09/2011   HCT 35.7* 07/09/2011   MCV 90.6 07/09/2011   PLT 240  07/09/2011      Component Value Date/Time   NA 138 05/20/2011 1315   K 3.3* 05/20/2011 1315   CL 110 05/20/2011 1315   CO2 18* 05/20/2011 1315   GLUCOSE 71 05/20/2011 1315   BUN 10 05/20/2011 1315   CREATININE 0.8 05/20/2011 1315   CALCIUM 8.4 05/20/2011 1315   PROT 6.6 05/20/2011 1315   ALBUMIN 4.1 05/20/2011 1315   AST 20 05/20/2011 1315   ALT 22 05/20/2011 1315   ALKPHOS 44 05/20/2011 1315   BILITOT 0.8 05/20/2011 1315   GFRNONAA 113 02/07/2008 0000   GFRAA 137 02/07/2008 0000   No results found for this basename: CHOL, HDL, LDLCALC, LDLDIRECT, TRIG, CHOLHDL   No results found for this basename: HGBA1C   Lab Results  Component Value Date   VITAMINB12 369 05/21/2011   Lab Results  Component Value Date   TSH 2.09 05/20/2011    02/28/08 MRI brain - normal   ASSESSMENT  AND PLAN  27 y.o. female with migraine headaches.  She has tried a variety of preventative and symptomatic migraine medications without significant relief.   Tried maxalt, zomig, frova, sumatriptan, cambia, Depakote 1000 mg a day, verapamil 240 mg daily, Topamax 150 mg a day, chlorpromazine, cymbalta, naproxen, trigger point injections and Botox without relief.    PLAN: 1. continue TPX 50mg  BID (may consider extended release in future) 2. Reduce propranolol to 20mg  BID (h/o low BP, presyncope events)  Meds ordered this encounter  Medications  . propranolol (INDERAL) 20 MG tablet    Sig: Take 1 tablet (20 mg total) by mouth 2 (two) times daily.    Dispense:  60 tablet    Refill:  12    Return in about 3 months (around 09/24/2013) for with Heide Guile or Penumalli.    Suanne Marker, MD 06/25/2013, 3:02 PM Certified in Neurology, Neurophysiology and Neuroimaging  [facility] [address] [place], [place] [address] [phone]

## 2013-06-29 ENCOUNTER — Telehealth: Payer: Self-pay | Admitting: Internal Medicine

## 2013-06-29 NOTE — Telephone Encounter (Signed)
Recd records from [facility], Forwarding 3pg to Dr.John

## 2013-07-02 ENCOUNTER — Ambulatory Visit (INDEPENDENT_AMBULATORY_CARE_PROVIDER_SITE_OTHER): Payer: 59 | Admitting: Family Medicine

## 2013-07-02 VITALS — BP 110/76 | HR 77 | Temp 98.9°F | Resp 16 | Ht 64.5 in | Wt 136.0 lb

## 2013-07-02 DIAGNOSIS — J029 Acute pharyngitis, unspecified: Secondary | ICD-10-CM

## 2013-07-02 DIAGNOSIS — J069 Acute upper respiratory infection, unspecified: Secondary | ICD-10-CM

## 2013-07-02 MED ORDER — AMOXICILLIN 875 MG PO TABS: 875.0000 mg | ORAL_TABLET | Freq: Two times a day (BID) | ORAL | Status: DC

## 2013-07-02 NOTE — Patient Instructions (Addendum)
Sodium bicarbonate tablets can lessen the effects of the topamax   Sinusitis Sinusitis is redness, soreness, and swelling (inflammation) of the paranasal sinuses. Paranasal sinuses are air pockets within the bones of your face (beneath the eyes, the middle of the forehead, or above the eyes). In healthy paranasal sinuses, mucus is able to drain out, and air is able to circulate through them by way of your nose. However, when your paranasal sinuses are inflamed, mucus and air can become trapped. This can allow bacteria and other germs to grow and cause infection. Sinusitis can develop quickly and last only a short time (acute) or continue over a long period (chronic). Sinusitis that lasts for more than 12 weeks is considered chronic.  CAUSES  Causes of sinusitis include:  Allergies.  Structural abnormalities, such as displacement of the cartilage that separates your nostrils (deviated septum), which can decrease the air flow through your nose and sinuses and affect sinus drainage.  Functional abnormalities, such as when the small hairs (cilia) that line your sinuses and help remove mucus do not work properly or are not present. SYMPTOMS  Symptoms of acute and chronic sinusitis are the same. The primary symptoms are pain and pressure around the affected sinuses. Other symptoms include:  Upper toothache.  Earache.  Headache.  Bad breath.  Decreased sense of smell and taste.  A cough, which worsens when you are lying flat.  Fatigue.  Fever.  Thick drainage from your nose, which often is green and may contain pus (purulent).  Swelling and warmth over the affected sinuses. DIAGNOSIS  Your caregiver will perform a physical exam. During the exam, your caregiver may:  Look in your nose for signs of abnormal growths in your nostrils (nasal polyps).  Tap over the affected sinus to check for signs of infection.  View the inside of your sinuses (endoscopy) with a special imaging device  with a light attached (endoscope), which is inserted into your sinuses. If your caregiver suspects that you have chronic sinusitis, one or more of the following tests may be recommended:  Allergy tests.  Nasal culture A sample of mucus is taken from your nose and sent to a lab and screened for bacteria.  Nasal cytology A sample of mucus is taken from your nose and examined by your caregiver to determine if your sinusitis is related to an allergy. TREATMENT  Most cases of acute sinusitis are related to a viral infection and will resolve on their own within 10 days. Sometimes medicines are prescribed to help relieve symptoms (pain medicine, decongestants, nasal steroid sprays, or saline sprays).  However, for sinusitis related to a bacterial infection, your caregiver will prescribe antibiotic medicines. These are medicines that will help kill the bacteria causing the infection.  Rarely, sinusitis is caused by a fungal infection. In theses cases, your caregiver will prescribe antifungal medicine. For some cases of chronic sinusitis, surgery is needed. Generally, these are cases in which sinusitis recurs more than 3 times per year, despite other treatments. HOME CARE INSTRUCTIONS   Drink plenty of water. Water helps thin the mucus so your sinuses can drain more easily.  Use a humidifier.  Inhale steam 3 to 4 times a day (for example, sit in the bathroom with the shower running).  Apply a warm, moist washcloth to your face 3 to 4 times a day, or as directed by your caregiver.  Use saline nasal sprays to help moisten and clean your sinuses.  Take over-the-counter or prescription medicines for pain,  discomfort, or fever only as directed by your caregiver. SEEK IMMEDIATE MEDICAL CARE IF:  You have increasing pain or severe headaches.  You have nausea, vomiting, or drowsiness.  You have swelling around your face.  You have vision problems.  You have a stiff neck.  You have difficulty  breathing. MAKE SURE YOU:   Understand these instructions.  Will watch your condition.  Will get help right away if you are not doing well or get worse. Document Released: 10/04/2005 Document Revised: 12/27/2011 Document Reviewed: 10/19/2011 [facility] Patient Information 2014 [place], [place].

## 2013-07-02 NOTE — Progress Notes (Signed)
  Subjective:    Patient ID: Alicia Jones, female    DOB: October 01, 1986, 27 y.o.   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 Symptoms have been going on for a week. [facility] and the over-the-counter medicines have not been helping. Back she's been getting worse.  Review of Systems Fever    Objective:   Physical Exam HEENT: Unremarkable except for swollen nasal passages and mucopurulent discharge Chest: Clear Neck: Supple no adenopathy       Assessment & Plan:  Sinusitis with upper respiratory infection Acute pharyngitis - Plan: amoxicillin (AMOXIL) 875 MG tablet  Acute upper respiratory infections of unspecified site - Plan: amoxicillin (AMOXIL) 875 MG tablet  Signed, Elvina Sidle, MD

## 2013-09-24 ENCOUNTER — Ambulatory Visit: Payer: 59 | Admitting: Nurse Practitioner

## 2013-09-28 ENCOUNTER — Encounter (INDEPENDENT_AMBULATORY_CARE_PROVIDER_SITE_OTHER): Payer: Self-pay

## 2013-09-28 ENCOUNTER — Encounter: Payer: Self-pay | Admitting: Nurse Practitioner

## 2013-09-28 ENCOUNTER — Ambulatory Visit (INDEPENDENT_AMBULATORY_CARE_PROVIDER_SITE_OTHER): Payer: 59 | Admitting: Nurse Practitioner

## 2013-09-28 VITALS — BP 101/64 | HR 65 | Temp 97.3°F | Ht 64.0 in | Wt 144.0 lb

## 2013-09-28 DIAGNOSIS — G43909 Migraine, unspecified, not intractable, without status migrainosus: Secondary | ICD-10-CM

## 2013-09-28 MED ORDER — ONDANSETRON HCL 4 MG PO TABS: 4.0000 mg | ORAL_TABLET | Freq: Three times a day (TID) | ORAL | Status: DC | PRN

## 2013-09-28 MED ORDER — TROKENDI XR 50 MG PO CP24: 1.0000 | ORAL_CAPSULE | Freq: Every day | ORAL | Status: DC

## 2013-09-28 NOTE — Patient Instructions (Addendum)
PLAN:  1. Switch to Trokendi XR 50 mg daily. Sample pack given. Rx with discount card given. 2. Continue propranolol to 20mg  daily. Return in about 3 months (around 09/24/2013) for with Heide Guile or Penumalli.

## 2013-09-28 NOTE — Progress Notes (Signed)
PATIENT: Alicia Jones DOB: Mar 05, 1986   REASON FOR VISIT: follow up for Migraine HISTORY FROM: patient  HISTORY OF PRESENT ILLNESS: UPDATE 09/28/13 (LL):   Headaches have been better, she has been decreasing her propranolol to a once daily dose.  Also TPX 50mg  BID.  She has some tingling, mild brain fog, she thinks from TPX.  Migraines are averaging 3-4 per month.  Sometimes has nausea with Migraine, no aura.  Advil for acute headaches usually relieves.  She is using oral BCP's for contraception.  She has migraine usually around start time of menses.  UPDATE 06/25/13: Doing well. Having 1 HA every 10 days. Now on propranolol 40mg  BID + TPX 50mg  BID. Uses advil prn HA. Asking about family planning / conception and medication side effects.   UPDATE 11/14/12: Since last visit, tapered off gabapentin, now on propranolol 80mg  qhs. Lower TPX (50mg  BID). Satisfied with current regimen. No lightheadedness or dizziness. Uses advil migraine prn. 2-3 HA per month (3-4 days per HA).   UPDATE 03/23/12: Returns for follow up since last visit. Continues on Topirimate 100mg  BID which is causing tingling in her fingers and focus problems and gabapentin 600mg  BID tolerating well. She has had 4 days severe migraines per month and a dull headache 2-3 days per week. Notices worse with increased stress, increased sleep and alcohol beverages intake. Takes an occasional Advil.  PRIOR HPI: 27 year old female with history of migraine headaches, presenting for evaluation and management of intractable headaches.  Patient reports history of migraine headaches in the past 2 years. She reports bifrontal, occipital, severe headaches with squeezing pressure associated with dizziness, nausea, photophobia and phonophobia. She has 1-2 severe headaches per week; she has a low-grade daily headache as well. She has increased headaches with her menstrual cycle and with sleep deprivation. She denies any food, caffeine or other triggers.  She has been evaluated at the headache and wellness clinic tried a variety of treatments including Depakote 1000 mg a day, verapamil 240 mg daily, Topamax 150 mg a day, chlorpromazine, sumatriptan, naproxen, trigger point injections and Botox. Patient thinks that Topamax seemed to help the most. Over the past several weeks she is weaning herself off of most of these medications and currently is taking Topamax 50 mg per day and Excedrin Migraine as needed for breakthrough headaches. She has tried sumatriptan and naproxen combination 3 times for breakthrough headache without significant relief.   REVIEW OF SYSTEMS: Full 14 system review of systems performed and notable only for HA, memory loss confusion numbness weakness, anxiety none of sleep racing thoughts, diarrhea constipation feeling had increased thirst, weight gain fatigue itching.   ALLERGIES: No Known Allergies  HOME MEDICATIONS: Outpatient Prescriptions Prior to Visit  Medication Sig Dispense Refill  . Levonorgestrel-Ethinyl Estradiol (AMETHIA,CAMRESE) 0.1-0.02 & 0.01 MG tablet Take 1 tablet by mouth daily.       . propranolol (INDERAL) 20 MG tablet Take 1 tablet (20 mg total) by mouth 2 (two) times daily.  60 tablet  12  . topiramate (TOPAMAX) 50 MG tablet Take 50 mg by mouth 2 (two) times daily.       Marland Kitchen amoxicillin (AMOXIL) 875 MG tablet Take 1 tablet (875 mg total) by mouth 2 (two) times daily.  20 tablet  0  . dextromethorphan-guaiFENesin (MUCINEX DM) 30-600 MG per 12 hr tablet Take 1 tablet by mouth every 12 (twelve) hours.      . fexofenadine (ALLEGRA) 30 MG tablet Take 30 mg by mouth 2 (two)  times daily.       No facility-administered medications prior to visit.    PAST MEDICAL HISTORY: Past Medical History  Diagnosis Date  . Acute tonsillitis 09/13/2008  . ALLERGIC RHINITIS 02/07/2008  . MIGRAINE HEADACHE 02/03/2010  . SPRAIN&STRAIN OTH SPEC SITES SHOULDER&UPPER ARM 02/03/2010  . Weight loss, unintentional   . Chest pain     . Nausea and vomiting   . Constipation   . Abdominal pain   . Chest pain   . Diarrhea   . Generalized headaches   . Weakness   . Easy bruising   . Abdominal pain, periumbilical     PAST SURGICAL HISTORY: Past Surgical History  Procedure Laterality Date  . Laparoscopic cholecystectomy w/ cholangiography  07/12/11  . Cholecystectomy  07/2011    FAMILY HISTORY: Family History  Problem Relation Age of Onset  . Diabetes Father   . Gallbladder disease Father   . Liver disease Father   . Breast cancer Maternal Grandmother   . Cancer Maternal Grandmother     breast  . Lung cancer Maternal Grandfather   . Colon cancer Neg Hx   . Cancer Paternal Grandmother     breast  . Cancer Paternal Grandfather     lung    SOCIAL HISTORY: History   Social History  . Marital Status: Married    Spouse Name: josh    Number of Children: 0  . Years of Education: College   Occupational History  . 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 [employer]    Social History Main Topics  . Smoking status: Never Smoker   . Smokeless tobacco: Never Used  . Alcohol Use: Yes     Comment: social  . Drug Use: No  . Sexual Activity: Yes    Birth Control/ Protection: Pill   Other Topics Concern  . Not on file   Social History Narrative   Patient lives at home with her spouse.    Caffeine Use: consumes every other day     PHYSICAL EXAM  Filed Vitals:   09/28/13 1331  BP: 101/64  Pulse: 65  Temp: 97.3 F (36.3 C)  TempSrc: Oral  Height: 5\' 4"  (1.626 m)  Weight: 144 lb (65.318 kg)   Body mass index is 24.71 kg/(m^2).  Generalized: Well developed, in no acute distress, pleasant Caucasian female  Head: normocephalic and atraumatic. Oropharynx benign  Neck: Supple, no carotid bruits  Cardiac: Regular rate rhythm, no murmur  Musculoskeletal: No deformity   Neurological examination  MENTAL STATUS: awake, alert, language fluent, comprehension intact, naming intact  CRANIAL NERVE:  pupils equal and reactive to light, visual fields full to confrontation, extraocular muscles intact, no nystagmus, facial sensation and strength symmetric, uvula midline, shoulder shrug symmetric, tongue midline.  MOTOR: normal bulk and tone, full strength in the BUE, BLE  SENSORY: normal and symmetric to light touch  COORDINATION: finger-nose-finger, fine finger movements normal  REFLEXES: deep tendon reflexes present and symmetric  GAIT/STATION: narrow based gait, able to heel, toe and tandem walk without difficulty.  DIAGNOSTIC DATA (LABS, IMAGING, TESTING) - I reviewed patient records, labs, notes, testing and imaging myself where available.  02/28/08 MRI brain - normal   ASSESSMENT AND PLAN  27 y.o. female with migraine headaches. She has tried a variety of preventative and symptomatic migraine medications without significant relief. Tried maxalt, zomig, frova, sumatriptan, cambia, Depakote 1000 mg a day, verapamil 240 mg daily, Topamax 150 mg a day, chlorpromazine, cymbalta, naproxen, trigger point injections  and Botox without relief. Discussed importance of reliable birth control and planning pregnancy in the future.  She is planning for pregnancy approximately 2 years from now.  PLAN:  1. Switch to Trokendi XR 50 mg daily. Rx and Sample pack given.   2. Reduce propranolol to 20mg  daily (h/o low BP, presyncope events)  3. Zofran 4 mg q 8hr as needed for nausea from Migraine. Return in about 3 months (around 09/24/2013) for with Heide Guile or Penumalli.  Meds ordered this encounter  Medications  . propranolol (INDERAL) 20 MG tablet    Sig: Take 20 mg by mouth daily.  . ondansetron (ZOFRAN) 4 MG tablet    Sig: Take 1 tablet (4 mg total) by mouth every 8 (eight) hours as needed for nausea or vomiting.    Dispense:  10 tablet    Refill:  5    Order Specific Question:  Supervising Provider    Answer:  Joycelyn Schmid R [[id number]]  . TROKENDI XR 50 MG CP24    Sig: Take 1 tablet by mouth  daily.    Dispense:  30 capsule    Refill:  11    Order Specific Question:  Supervising Provider    Answer:  Suanne Marker [[id number]]   Return in about 3 months (around 12/27/2013).  Ronal Fear, MSN, NP-C 09/28/2013, 2:14 PM [facility] [address] [place], [place] [address] [phone]  Note: This document was prepared with digital dictation and possible smart phrase technology. Any transcriptional errors that result from this process are unintentional.

## 2013-09-30 ENCOUNTER — Encounter: Payer: Self-pay | Admitting: Nurse Practitioner

## 2013-11-30 ENCOUNTER — Telehealth: Payer: Self-pay | Admitting: Nurse Practitioner

## 2013-11-30 NOTE — Telephone Encounter (Signed)
Patient calling for refill of Trokendi XR.

## 2013-11-30 NOTE — Telephone Encounter (Signed)
We sent 12 refills to the pharmacy in Dec.  I called the pharmacy, and they said she has plenty of refills on file, she just needs to call them to request Rx.  I called the patient back, explained there are refills on file at the pharmacy, and she could call them to request Rx refill.  She verbalized understanding.

## 2014-01-17 ENCOUNTER — Telehealth: Payer: Self-pay | Admitting: Diagnostic Neuroimaging

## 2014-01-17 NOTE — Telephone Encounter (Signed)
Pt called and stated that she will need prior authorization for her Trokendi before her next refill. She said her insurance is [employer] and their phone number is [phone]. Please call the Pt with any questions.  Thank You

## 2014-01-17 NOTE — Telephone Encounter (Signed)
All requested info has been submitted to ins.  Pending response.

## 2014-01-22 ENCOUNTER — Telehealth: Payer: Self-pay | Admitting: Diagnostic Neuroimaging

## 2014-01-22 NOTE — Telephone Encounter (Signed)
This is ending ins response, which typically takes ~3-5 business days.  The ins will notify the patient once a decision has been made.  I called the patient.  She is aware.

## 2014-01-22 NOTE — Telephone Encounter (Signed)
Pt called to check the status of the prior authorization.  Please call her to update her on the status.  Please reference telephone message from 01-17-14.  Thank you

## 2014-03-18 ENCOUNTER — Other Ambulatory Visit: Payer: Self-pay | Admitting: Nurse Practitioner

## 2014-03-18 ENCOUNTER — Telehealth: Payer: Self-pay | Admitting: Nurse Practitioner

## 2014-03-18 MED ORDER — TOPIRAMATE 25 MG PO TABS: 25.0000 mg | ORAL_TABLET | Freq: Every day | ORAL | Status: DC

## 2014-03-18 NOTE — Telephone Encounter (Signed)
Patient calling wanting to know if she could switch from TROKENDI XR 50 MG CP24 to Topomax 25 mg.  She really wants to wean off of the Trokendi XR.  Please call and advise

## 2014-03-18 NOTE — Telephone Encounter (Signed)
Patient called and stated changed Pharmacy to [facility].  Please resend Rx.  Thanks

## 2014-03-18 NOTE — Telephone Encounter (Signed)
That is fine, I sent in a Rx for Topamax 25 mg to [facility].  Please advise her to stop the medication if she finds out that she is pregnant.

## 2014-03-18 NOTE — Telephone Encounter (Signed)
I called the patient back.  She said she would like to change from Trokendi XR 50mg  to generic Topamax 25mg  tabs.  Says they are trying to start a family and she would like to change back to the lower dose of Topamax to slowly wean the medication.  She also wanted to make the provider aware she chose to stop taking Propranolol 3 weeks ago and has been doing well since she d/c that drug.  Please advise.  Thank you.

## 2014-03-18 NOTE — Telephone Encounter (Signed)
I called the patient back.  Relayed Lynn's message.  She verbalized understanding.  She would like the Rx resent to [facility] [address].  Rx has been resent.

## 2014-03-27 ENCOUNTER — Ambulatory Visit: Payer: 59 | Admitting: Nurse Practitioner

## 2014-04-02 ENCOUNTER — Other Ambulatory Visit (INDEPENDENT_AMBULATORY_CARE_PROVIDER_SITE_OTHER): Payer: No Typology Code available for payment source

## 2014-04-02 ENCOUNTER — Encounter: Payer: Self-pay | Admitting: Internal Medicine

## 2014-04-02 ENCOUNTER — Ambulatory Visit (INDEPENDENT_AMBULATORY_CARE_PROVIDER_SITE_OTHER): Payer: No Typology Code available for payment source | Admitting: Internal Medicine

## 2014-04-02 VITALS — BP 98/62 | HR 77 | Temp 98.3°F | Ht 63.5 in | Wt 139.3 lb

## 2014-04-02 DIAGNOSIS — K921 Melena: Secondary | ICD-10-CM

## 2014-04-02 DIAGNOSIS — R197 Diarrhea, unspecified: Secondary | ICD-10-CM

## 2014-04-02 LAB — URINALYSIS, ROUTINE W REFLEX MICROSCOPIC
Bilirubin Urine: NEGATIVE
Hgb urine dipstick: NEGATIVE
Ketones, ur: NEGATIVE
LEUKOCYTES UA: NEGATIVE
NITRITE: NEGATIVE
PH: 7.5 (ref 5.0–8.0)
SPECIFIC GRAVITY, URINE: 1.02 (ref 1.000–1.030)
TOTAL PROTEIN, URINE-UPE24: NEGATIVE
Urine Glucose: NEGATIVE
Urobilinogen, UA: 0.2 (ref 0.0–1.0)

## 2014-04-02 LAB — CBC WITH DIFFERENTIAL/PLATELET
BASOS ABS: 0 10*3/uL (ref 0.0–0.1)
Basophils Relative: 0.5 % (ref 0.0–3.0)
EOS PCT: 1.4 % (ref 0.0–5.0)
Eosinophils Absolute: 0.1 10*3/uL (ref 0.0–0.7)
HEMATOCRIT: 40.3 % (ref 36.0–46.0)
Hemoglobin: 13.9 g/dL (ref 12.0–15.0)
LYMPHS ABS: 1.9 10*3/uL (ref 0.7–4.0)
LYMPHS PCT: 41.3 % (ref 12.0–46.0)
MCHC: 34.6 g/dL (ref 30.0–36.0)
MCV: 97.3 fl (ref 78.0–100.0)
MONOS PCT: 7.2 % (ref 3.0–12.0)
Monocytes Absolute: 0.3 10*3/uL (ref 0.1–1.0)
NEUTROS PCT: 49.6 % (ref 43.0–77.0)
Neutro Abs: 2.3 10*3/uL (ref 1.4–7.7)
Platelets: 307 10*3/uL (ref 150.0–400.0)
RBC: 4.15 Mil/uL (ref 3.87–5.11)
RDW: 12.5 % (ref 11.5–15.5)
WBC: 4.6 10*3/uL (ref 4.0–10.5)

## 2014-04-02 LAB — BASIC METABOLIC PANEL
BUN: 9 mg/dL (ref 6–23)
CO2: 24 meq/L (ref 19–32)
Calcium: 9.3 mg/dL (ref 8.4–10.5)
Chloride: 107 mEq/L (ref 96–112)
Creatinine, Ser: 0.8 mg/dL (ref 0.4–1.2)
GFR: 94.55 mL/min (ref >60.00)
Glucose, Bld: 86 mg/dL (ref 70–99)
Potassium: 4.8 mEq/L (ref 3.5–5.1)
Sodium: 137 mEq/L (ref 135–145)

## 2014-04-02 LAB — HEPATIC FUNCTION PANEL
ALT: 16 U/L (ref 0–35)
AST: 19 U/L (ref 0–37)
Albumin: 4 g/dL (ref 3.5–5.2)
Alkaline Phosphatase: 44 U/L (ref 39–117)
Bilirubin, Direct: 0.1 mg/dL (ref 0.0–0.3)
TOTAL PROTEIN: 6.7 g/dL (ref 6.0–8.3)
Total Bilirubin: 0.4 mg/dL (ref 0.2–1.2)

## 2014-04-02 LAB — LIPASE: LIPASE: 45 U/L (ref 11.0–59.0)

## 2014-04-02 LAB — PROTIME-INR
INR: 0.9 ratio (ref 0.8–1.0)
PROTHROMBIN TIME: 10.4 s (ref 9.6–13.1)

## 2014-04-02 MED ORDER — DIPHENOXYLATE-ATROPINE 2.5-0.025 MG PO TABS: 1.0000 | ORAL_TABLET | Freq: Four times a day (QID) | ORAL | Status: DC | PRN

## 2014-04-02 NOTE — Patient Instructions (Signed)
Please take all new medication as prescribed  Please continue all other medications as before, and refills have been done if requested.  Please have the pharmacy call with any other refills you may need.  Please continue your efforts at being more active, low cholesterol diet, and weight control.  You will be contacted regarding the referral for: GI  Please go to the LAB in the Basement (turn left off the elevator) for the tests to be done today  You will be contacted by phone if any changes need to be made immediately.  Otherwise, you will receive a letter about your results with an explanation, but please check with MyChart first.

## 2014-04-02 NOTE — Assessment & Plan Note (Signed)
Small volume ? Minor by hx, but also for GI referral - ? Need colonscopy r/o IBD

## 2014-04-02 NOTE — Progress Notes (Signed)
Pre visit review using our clinic review tool, if applicable. No additional management support is needed unless otherwise documented below in the visit note. 

## 2014-04-02 NOTE — Progress Notes (Signed)
Subjective:    Patient ID: Alicia Jones, female    DOB: 04/10/1986, 28 y.o.   MRN: [id number]  HPI  Here with acute on chronic loose to watery stools x 1 wk; S/p ccx after seeing Dr Ulyses AmorPatterson/GI 2012, since then with ongoing 3 yrs recurrent loose stools with eating, then had ? Viral illness last yr with particularly signifcant BM's (hourly for 2 days); seemed dark/black stools but also taking pepto bismol, better off when she realized what might be the cause; did have small BRB x 2 episodes with wiping, some low grade temp, + nausea, no vomiting, has been able to keep down fluids.  Some overall improved yesterdaay overall, almost back to usual, no  Overall wt loss. Denies urinary symptoms such as dysuria, frequency, urgency, flank pain, hematuria. Past Medical History  Diagnosis Date  . Acute tonsillitis 09/13/2008  . ALLERGIC RHINITIS 02/07/2008  . MIGRAINE HEADACHE 02/03/2010  . SPRAIN&STRAIN OTH SPEC SITES SHOULDER&UPPER ARM 02/03/2010  . Weight loss, unintentional   . Chest pain   . Nausea and vomiting   . Constipation   . Abdominal pain   . Chest pain   . Diarrhea   . Generalized headaches   . Weakness   . Easy bruising   . Abdominal pain, periumbilical    Past Surgical History  Procedure Laterality Date  . Laparoscopic cholecystectomy w/ cholangiography  07/12/11  . Cholecystectomy  07/2011    reports that she has never smoked. She has never used smokeless tobacco. She reports that she drinks alcohol. She reports that she does not use illicit drugs. family history includes Breast cancer in her maternal grandmother; Cancer in her maternal grandmother, paternal grandfather, and paternal grandmother; Diabetes in her father; Gallbladder disease in her father; Liver disease in her father; Lung cancer in her maternal grandfather. There is no history of Colon cancer. No Known Allergies Current Outpatient Prescriptions on File Prior to Visit  Medication Sig Dispense Refill  .  Levonorgestrel-Ethinyl Estradiol (AMETHIA,CAMRESE) 0.1-0.02 & 0.01 MG tablet Take 1 tablet by mouth daily.       Marland Kitchen. topiramate (TOPAMAX) 25 MG tablet Take 1 tablet (25 mg total) by mouth daily.  30 tablet  3   No current facility-administered medications on file prior to visit.   Review of Systems  Constitutional: Negative for unusual diaphoresis or other sweats  HENT: Negative for ringing in ear Eyes: Negative for double vision or worsening visual disturbance.  Respiratory: Negative for choking and stridor.   Gastrointestinal: Negative for vomiting or other signifcant bowel change Genitourinary: Negative for hematuria or decreased urine volume.  Musculoskeletal: Negative for other MSK pain or swelling Skin: Negative for color change and worsening wound, no rash.  Neurological: Negative for tremors and numbness other than noted  Psychiatric/Behavioral: Negative for decreased concentration or agitation other than above  ;    Objective:   Physical Exam BP 98/62  Pulse 77  Temp(Src) 98.3 F (36.8 C) (Oral)  Ht 5' 3.5" (1.613 m)  Wt 139 lb 5 oz (63.192 kg)  BMI 24.29 kg/m2  SpO2 98% VS noted,  Constitutional: Pt appears well-developed, well-nourished.  HENT: Head: NCAT.  Right Ear: External ear normal.  Left Ear: External ear normal.  Eyes: . Pupils are equal, round, and reactive to light. Conjunctivae and EOM are normal Neck: Normal range of motion. Neck supple.  Cardiovascular: Normal rate and regular rhythm.   Pulmonary/Chest: Effort normal and breath sounds normal.  Abd:  Soft, NT, ND, +  BSexcept for mild epigastric tender Neurological: Pt is alert. Not confused , motor grossly intact Skin: Skin is warm. No rash Psychiatric: Pt behavior is normal. No agitation.     Assessment & Plan:

## 2014-04-02 NOTE — Assessment & Plan Note (Signed)
Acute on chronic, ? Viral on top of prior s/p CCX , IBS but cant r/o IBD especially with recent BRB and other dark stool color; for labs including celiac studies, INR, refer GI, lomotil prn

## 2014-04-03 ENCOUNTER — Telehealth: Payer: Self-pay | Admitting: Gastroenterology

## 2014-04-03 ENCOUNTER — Encounter: Payer: Self-pay | Admitting: Internal Medicine

## 2014-04-03 NOTE — Telephone Encounter (Signed)
Patient states she would like an earlier OV that August for diarrhea, rectal bleeding. No preference for MD. Scheduled with Doug SouJessica Zehr, PA on 04/08/14 at 2:30 PM.

## 2014-04-08 ENCOUNTER — Ambulatory Visit (INDEPENDENT_AMBULATORY_CARE_PROVIDER_SITE_OTHER): Payer: No Typology Code available for payment source | Admitting: Gastroenterology

## 2014-04-08 ENCOUNTER — Encounter: Payer: Self-pay | Admitting: Gastroenterology

## 2014-04-08 VITALS — BP 110/78 | HR 78 | Ht 63.0 in | Wt 139.2 lb

## 2014-04-08 DIAGNOSIS — R197 Diarrhea, unspecified: Secondary | ICD-10-CM

## 2014-04-08 DIAGNOSIS — R109 Unspecified abdominal pain: Secondary | ICD-10-CM

## 2014-04-08 MED ORDER — DICYCLOMINE HCL 10 MG PO CAPS
ORAL_CAPSULE | ORAL | Status: DC
Start: 2014-04-08 — End: 2015-11-03

## 2014-04-08 NOTE — Progress Notes (Signed)
     04/08/2014 Alicia Jones [id number] 08/16/1986   History of Present Illness:  This is a pleasant 28 year old female who is previously known to Dr. Jarold MottoPatterson. She underwent EGD in August 2012 at which time the study was normal and small bowel biopsies were negative for celiac disease. At that time she had been complaining of crampy abdominal pain and diarrhea. She comes to our office today with ongoing complaints of diarrhea and abdominal pain. She states that the diarrhea has been persistent since she was seen in 2012. She says that she has to have a bowel movement after every time she eats and the stools are usually loose to watery. She notes that this has been a problemsince her gallbladder removal. She gets diffuse crampy abdominal pain prior to bowel movements, but the cramps usually persist even after passing her stool. She denies seeing red blood in her stool, but reports that she did have some black color in her stools on couple of occasions recently. She does admit that she had been taking Pepto-Bismol for a couple of days prior to the darker stools.  Her recent CBC, CMP, urinalysis, lipase, and PT/INR were normal.  She sometimes gets nauseated and weak feeling when she has more severe cramping pain as well.   Current Medications, Allergies, Past Medical History, Past Surgical History, Family History and Social History were reviewed in [facility] electronic medical record.   Physical Exam: BP 110/78  Pulse 78  Ht 5\' 3"  (1.6 m)  Wt 139 lb 3.2 oz (63.141 kg)  BMI 24.66 kg/m2 General: Well developed female in no acute distress Head: Normocephalic and atraumatic Eyes:  Sclerae anicteric, conjunctiva pink  Ears: Normal auditory acuity Lungs: Clear throughout to auscultation Heart: Regular rate and rhythm Abdomen: Soft, non-distended.  Normal bowel sounds.  Mild diffuse TTP without R/R/G. Rectal:  Deferred.  Will be done at the time of colonoscopy. Musculoskeletal: Symmetrical  with no gross deformities  Extremities: No edema  Neurological: Alert oriented x 4, grossly non-focal Psychological:  Alert and cooperative. Normal mood and affect  Assessment and Recommendations: -Diarrhea:  Acute on chronic.  Chronically, her symptoms could be secondary to lactose intolerance, inflammatory bowel disease, IBS, or bile salt related. Her diarrhea has been acutely worse recently. We'll check stool studies.  We'll also schedule colonoscopy to rule out IBD. In the interim, she will try to follow a lactose-free diet to see if this allows any improvement in her symptoms. She will also try Bentyl 10 mg twice daily for abdominal cramping/spasming.  The risks, benefits, and alternatives were discussed with the patient and she consents to proceed.

## 2014-04-08 NOTE — Patient Instructions (Addendum)
Please go to the basement level to our lab for a stool study. We have given you a Lactose intolerance diet. You have been scheduled for a colonoscopy. Please follow written instructions given to you at your visit today.  We have given you a sample prep for the colonoscopy. If you use inhalers (even only as needed), please bring them with you on the day of your procedure. Your physician has requested that you go to www.startemmi.com and enter the access code given to you at your visit today. This web site gives a general overview about your procedure. However, you should still follow specific instructions given to you by our office regarding your preparation for the procedure.  [facility] for Bentyl 10 mg.

## 2014-04-09 NOTE — Progress Notes (Signed)
Agree w/ Ms. Zehr - if colonoscopy negative then will consider bile salt binder - may be post-cholecystectomy diarrhea.  Iva Booparl E. Gessner, MD, Clementeen GrahamFACG

## 2014-04-10 ENCOUNTER — Other Ambulatory Visit: Payer: No Typology Code available for payment source

## 2014-04-10 DIAGNOSIS — R197 Diarrhea, unspecified: Secondary | ICD-10-CM

## 2014-04-11 LAB — GASTROINTESTINAL PATHOGEN PANEL PCR
C. DIFFICILE TOX A/B, PCR: NEGATIVE
CAMPYLOBACTER, PCR: NEGATIVE
CRYPTOSPORIDIUM, PCR: NEGATIVE
E COLI (ETEC) LT/ST, PCR: NEGATIVE
E coli (STEC) stx1/stx2, PCR: NEGATIVE
E coli 0157, PCR: NEGATIVE
Giardia lamblia, PCR: NEGATIVE
Norovirus, PCR: NEGATIVE
Rotavirus A, PCR: NEGATIVE
Salmonella, PCR: NEGATIVE
Shigella, PCR: NEGATIVE

## 2014-04-15 ENCOUNTER — Encounter: Payer: Self-pay | Admitting: Internal Medicine

## 2014-04-16 ENCOUNTER — Encounter: Payer: Self-pay | Admitting: Internal Medicine

## 2014-04-17 ENCOUNTER — Telehealth: Payer: Self-pay | Admitting: *Deleted

## 2014-04-17 NOTE — Telephone Encounter (Signed)
I called advised the patient per Dr. Leone PayorGessner that there was no infection found in stool test. ( [facility] panel). She said the next step is her colonoscopy scheduled for 04-25-2014. I advised her it is scheduled with Dr. Leone PayorGessner for that date.

## 2014-04-24 ENCOUNTER — Telehealth: Payer: Self-pay | Admitting: Internal Medicine

## 2014-04-24 NOTE — Telephone Encounter (Signed)
Patient called me back and I advised her not to go donate Plasma today since her procedure is tomorrow.  She also asked about chewing gum today and I advised it was fine today but not tomorrow.  All questions were answered.

## 2014-04-24 NOTE — Telephone Encounter (Signed)
Attempted to call patient back and no answer.  I left a voicemail for patient to call back at [phone] around 12:30 pm or after.

## 2014-04-25 ENCOUNTER — Encounter: Payer: Self-pay | Admitting: Internal Medicine

## 2014-04-25 ENCOUNTER — Telehealth: Payer: Self-pay | Admitting: *Deleted

## 2014-04-25 ENCOUNTER — Ambulatory Visit (AMBULATORY_SURGERY_CENTER): Payer: No Typology Code available for payment source | Admitting: Internal Medicine

## 2014-04-25 VITALS — BP 115/77 | HR 61 | Temp 98.3°F | Ht 63.0 in | Wt 139.0 lb

## 2014-04-25 DIAGNOSIS — R197 Diarrhea, unspecified: Secondary | ICD-10-CM

## 2014-04-25 MED ORDER — SODIUM CHLORIDE 0.9 % IV SOLN: 500.0000 mL | INTRAVENOUS | Status: DC

## 2014-04-25 MED ORDER — COLESTIPOL HCL 5 G PO GRAN: 5.0000 g | GRANULES | Freq: Every day | ORAL | Status: DC

## 2014-04-25 NOTE — Telephone Encounter (Signed)
This is an addendum to telephone call on 04/24/2014.  Patient had called several times directly to 4th floor to ask about her insurance coverage for her colonoscopy scheduled for 04/25/2014.  I advised her that we could not put diagnosis code as screening if she is having problems and under the age of 28.  She still wanted to find out how much she would have to pay out of pocket if she decided to have the colonoscopy anyways.  I had advised her that Karolee StampsJanelle is over the pre-certification and transferred the call to ext. [phone].  Patient called back again and stated that she left a voicemail with Janelle.  I attempted to call Janelle while putting patient on hold and did not get her.  I then attempted to call Jenne CampusYesenia regarding patient's concerns regarding insurance coverage/patient's responsibility after colonoscopy.  I stressed that the patient wanted to find out prior to having to drink her prep at 5:00 pm.  Jenne CampusYesenia stated that both she and Janelle were on their lunch break and reassured that Karolee StampsJanelle would call patient back.  I then spoke with patient and told her that I was reassured that Karolee StampsJanelle would call her when she returns from lunch.  I advised patient to call me if she has not heard anything by 4 pm.  She agreed to do so.  I received another call directly to [facility] admitting around 4:30 with patient stating that [facility] to call her back and she had missed the call.  I then called Janelle at ext. [phone] as I put patient on hold and spoke with Janelle explaining the patient's concerns. I then transferred the call to [facility].

## 2014-04-25 NOTE — Progress Notes (Signed)
She decide not to pursue a colonoscopy. Will treat diarrhea with colestipol. Rx sent.

## 2014-04-25 NOTE — Telephone Encounter (Deleted)
I asked her to come to a private office to talk. She tells me that Alicia Jones was upset that she would not be able to pay for the procedure today.

## 2014-04-25 NOTE — Progress Notes (Signed)
After pt. Was checked in and IV infusing, she asked if I knew if her insurance would pay for the procedure.  She stated that she was told yesterday that it would probably be covered. I told her that I'd bring this to Dr. Marvell FullerGessner's attention for his advice.  Dr. Leone PayorGessner stated that according to her diagnosis, this would not be covered as a preventative procedure. After Talking with Dr. Leone PayorGessner, she requested that her husband come back to talk with her about whether or not to proceed.  Dr. Maia PlanGessner offfered to send her a prescription in case She decided to cancel today's procedure.   She was discharged at [id number].

## 2014-06-11 ENCOUNTER — Ambulatory Visit: Payer: No Typology Code available for payment source | Admitting: Internal Medicine

## 2014-10-23 NOTE — Telephone Encounter (Signed)
It was all a miss communication. Her daughter did not provide the correct information.

## 2015-10-19 NOTE — L&D Delivery Note (Signed)
Delivery Note At 4:54 AM a viable and healthy female was delivered via Vaginal, Spontaneous Delivery (Presentation: OA ).  APGAR: 8, 8; weight 6 lb 10.5 oz (3020 g).   Placenta status: delivered, intact, .  Cord: 3 vessels with the following complications: none  Anesthesia:  epidural Episiotomy: None Lacerations: 2nd degree;Perineal Suture Repair: 3.0 vicryl rapide Est. Blood Loss (mL): 200  Mom to postpartum.  Baby to Couplet care / Skin to Skin.  Bovard-Stuckert, Hendrik Donath 05/31/2016, 8:12 AM  B+/RI/Br/Contra?

## 2015-11-01 ENCOUNTER — Ambulatory Visit (INDEPENDENT_AMBULATORY_CARE_PROVIDER_SITE_OTHER): Payer: BLUE CROSS/BLUE SHIELD | Admitting: Emergency Medicine

## 2015-11-01 VITALS — BP 96/60 | HR 74 | Temp 98.1°F | Resp 18 | Ht 64.5 in | Wt 147.8 lb

## 2015-11-01 DIAGNOSIS — J020 Streptococcal pharyngitis: Secondary | ICD-10-CM

## 2015-11-01 MED ORDER — PENICILLIN V POTASSIUM 500 MG PO TABS: 500.0000 mg | ORAL_TABLET | Freq: Four times a day (QID) | ORAL | Status: DC

## 2015-11-01 NOTE — Progress Notes (Signed)
Subjective:  Patient ID: Alicia Jones, female    DOB: 24-Aug-1986  Age: 30 y.o. MRN: [id number]  CC: Sore Throat   HPI [name] presents  patient pregnant in her first trimester. She has a sore throat and fever. She has no cough runny nose here pain nausea vomiting or stool change. She has no shortness of breath or wheezing. She's been unable to control her symptoms with over-the-counter medication. She is experiencing morning sickness.  History Raelene has a past medical history of Acute tonsillitis (09/13/2008); ALLERGIC RHINITIS (02/07/2008); MIGRAINE HEADACHE (02/03/2010); SPRAIN&STRAIN OTH SPEC SITES SHOULDER&UPPER ARM (02/03/2010); Weight loss, unintentional; Chest pain; Nausea and vomiting; Constipation; Abdominal pain; Chest pain; Diarrhea; Generalized headaches; Weakness; Easy bruising; and Abdominal pain, periumbilical.   She has past surgical history that includes Laparoscopic cholecystectomy w/ cholangiography (07/12/11) and Cholecystectomy (07/2011).   Her  family history includes Breast cancer in her maternal grandmother; Cancer in her maternal grandmother, paternal grandfather, and paternal grandmother; Diabetes in her father; Gallbladder disease in her father; Liver disease in her father; Lung cancer in her maternal grandfather. There is no history of Colon cancer.  She   reports that she has never smoked. She has never used smokeless tobacco. She reports that she drinks alcohol. She reports that she does not use illicit drugs.  Outpatient Prescriptions Prior to Visit  Medication Sig Dispense Refill  . colestipol (COLESTID) 5 G granules Take 5 g by mouth daily. With a meal - recommend supper (Patient not taking: Reported on 11/01/2015) 500 g 12  . dicyclomine (BENTYL) 10 MG capsule Take 1 tab twice daily as needed for cramping and spasms (Patient not taking: Reported on 11/01/2015) 60 capsule 2  . diphenoxylate-atropine (LOMOTIL) 2.5-0.025 MG per tablet Take 1 tablet by  mouth 4 (four) times daily as needed for diarrhea or loose stools. (Patient not taking: Reported on 11/01/2015) 30 tablet 0  . Levonorgestrel-Ethinyl Estradiol (AMETHIA,CAMRESE) 0.1-0.02 & 0.01 MG tablet Take 1 tablet by mouth daily. Reported on 11/01/2015     No facility-administered medications prior to visit.    Social History   Social History  . Marital Status: Married    Spouse Name: josh  . Number of Children: 0  . Years of Education: College   Occupational History  . 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 [employer]    Social History Main Topics  . Smoking status: Never Smoker   . Smokeless tobacco: Never Used  . Alcohol Use: Yes     Comment: social  . Drug Use: No  . Sexual Activity: Yes    Birth Control/ Protection: Pill   Other Topics Concern  . None   Social History Narrative   Patient lives at home with her spouse.    Caffeine Use: consumes every other day     Review of Systems  Constitutional: Negative for fever, chills and appetite change.  HENT: Positive for sore throat. Negative for congestion, ear pain, postnasal drip and sinus pressure.   Eyes: Negative for pain and redness.  Respiratory: Negative for cough, shortness of breath and wheezing.   Cardiovascular: Negative for leg swelling.  Gastrointestinal: Negative for nausea, vomiting, abdominal pain, diarrhea, constipation and blood in stool.  Endocrine: Negative for polyuria.  Genitourinary: Negative for dysuria, urgency, frequency and flank pain.  Musculoskeletal: Negative for gait problem.  Skin: Negative for rash.  Neurological: Negative for weakness and headaches.  Psychiatric/Behavioral: Negative for confusion and decreased concentration. The patient is not nervous/anxious.  Objective:  BP 96/60 mmHg  Pulse 74  Temp(Src) 98.1 F (36.7 C) (Oral)  Resp 18  Ht 5' 4.5" (1.638 m)  Wt 147 lb 12.8 oz (67.042 kg)  BMI 24.99 kg/m2  SpO2 97%  Physical Exam  Constitutional: She is  oriented to person, place, and time. She appears well-developed and well-nourished. No distress.  HENT:  Head: Normocephalic and atraumatic.  Right Ear: External ear normal.  Left Ear: External ear normal.  Nose: Nose normal.  Mouth/Throat: Posterior oropharyngeal edema and posterior oropharyngeal erythema present. No oropharyngeal exudate or tonsillar abscesses.  Eyes: Conjunctivae and EOM are normal. Pupils are equal, round, and reactive to light. No scleral icterus.  Neck: Normal range of motion. Neck supple. No tracheal deviation present.  Cardiovascular: Normal rate, regular rhythm and normal heart sounds.   Pulmonary/Chest: Effort normal. No respiratory distress. She has no wheezes. She has no rales.  Abdominal: She exhibits no mass. There is no tenderness. There is no rebound and no guarding.  Musculoskeletal: She exhibits no edema.  Lymphadenopathy:    She has no cervical adenopathy.  Neurological: She is alert and oriented to person, place, and time. Coordination normal.  Skin: Skin is warm and dry. No rash noted.  Psychiatric: She has a normal mood and affect. Her behavior is normal.      Assessment & Plan:   [name] was seen today for sore throat.  Diagnoses and all orders for this visit:  Streptococcal sore throat  Other orders -     penicillin v potassium (VEETID) 500 MG tablet; Take 1 tablet (500 mg total) by mouth 4 (four) times daily.  I am having Ms. Birdie RiddleKendrick start on penicillin v potassium. I am also having her maintain her Levonorgestrel-Ethinyl Estradiol, diphenoxylate-atropine, dicyclomine, colestipol, metFORMIN, and Prenatal Vit-Fe Fumarate-FA (PRENATAL VITAMIN PO).  Meds ordered this encounter  Medications  . metFORMIN (GLUCOPHAGE) 500 MG tablet    Sig: Take 500 mg by mouth daily.  . Prenatal Vit-Fe Fumarate-FA (PRENATAL VITAMIN PO)    Sig: Take by mouth.  . penicillin v potassium (VEETID) 500 MG tablet    Sig: Take 1 tablet (500 mg total) by mouth 4  (four) times daily.    Dispense:  40 tablet    Refill:  0    Appropriate red flag conditions were discussed with the patient as well as actions that should be taken.  Patient expressed his understanding.  Follow-up: Return if symptoms worsen or fail to improve.  Carmelina DaneAnderson, Jerone Cudmore S, MD

## 2015-11-01 NOTE — Patient Instructions (Signed)

## 2015-11-03 ENCOUNTER — Ambulatory Visit (INDEPENDENT_AMBULATORY_CARE_PROVIDER_SITE_OTHER): Payer: BLUE CROSS/BLUE SHIELD | Admitting: Family Medicine

## 2015-11-03 VITALS — BP 118/68 | HR 90 | Temp 98.9°F | Resp 16 | Ht 64.0 in | Wt 146.0 lb

## 2015-11-03 DIAGNOSIS — R062 Wheezing: Secondary | ICD-10-CM

## 2015-11-03 DIAGNOSIS — R05 Cough: Secondary | ICD-10-CM

## 2015-11-03 DIAGNOSIS — B9789 Other viral agents as the cause of diseases classified elsewhere: Secondary | ICD-10-CM

## 2015-11-03 DIAGNOSIS — R059 Cough, unspecified: Secondary | ICD-10-CM

## 2015-11-03 DIAGNOSIS — R509 Fever, unspecified: Secondary | ICD-10-CM | POA: Diagnosis not present

## 2015-11-03 DIAGNOSIS — J069 Acute upper respiratory infection, unspecified: Secondary | ICD-10-CM

## 2015-11-03 LAB — POCT INFLUENZA A/B
INFLUENZA A, POC: NEGATIVE
Influenza B, POC: NEGATIVE

## 2015-11-03 MED ORDER — ALBUTEROL SULFATE HFA 108 (90 BASE) MCG/ACT IN AERS: 2.0000 | INHALATION_SPRAY | Freq: Four times a day (QID) | RESPIRATORY_TRACT | Status: DC | PRN

## 2015-11-03 NOTE — Progress Notes (Signed)
[facility] [address] [place] [address] [phone]  Date:  11/03/2015   Name:  Alicia Jones   DOB:  August 03, 1986   MRN:  [id number]  PCP:  Oliver Barre, MD    Chief Complaint: Cough; Wheezing; and pain upper chest   History of Present Illness:  Alicia Jones is a 30 y.o. very pleasant female patient who presents with the following:  She was just here 2 days ago with ST- she was dx with strep throat clinically but no test was done.  She is here today with complaint of cough and a burning in her chest.    Her throat feels a little better. Yesterday she noted onset of a cough, and now she notes that breathing hurts and coughing hurts her chest. She had some wheezing last night- she did not sleep well.  She does not generally wheeze and does not have a history of asthma   She is not sure if she had her flu shot yet this year or not  She has noted chills, aches.  She is feeling very tired but this may be due to pregnancy.   She has noted a temp to 99.3 at home.    She is also pregnant, currently at 9.5 weeks.  Pregnancy is going well so far, no bleeding. She has already had an [place] that confimred IUP  So far today she took just her penicillin and her vitmain B6 that she uses for pregnancy related nausea.   Patient Active Problem List   Diagnosis Date Noted  . Abdominal pain, unspecified site 04/08/2014  . Headache, common migraine 05/21/2011  . Long term current use of non-steroidal anti-inflammatories (NSAID) 05/21/2011  . Diarrhea 05/20/2011  . Hematochezia 05/20/2011  . Preventative health care 05/20/2011  . MIGRAINE HEADACHE 02/03/2010  . SPRAIN&STRAIN OTH SPEC SITES SHOULDER&UPPER ARM 02/03/2010  . ALLERGIC RHINITIS 02/07/2008  . DIZZINESS 02/07/2008    Past Medical History  Diagnosis Date  . Acute tonsillitis 09/13/2008  . ALLERGIC RHINITIS 02/07/2008  . MIGRAINE HEADACHE 02/03/2010  . SPRAIN&STRAIN OTH SPEC SITES SHOULDER&UPPER ARM  02/03/2010  . Weight loss, unintentional   . Chest pain   . Nausea and vomiting   . Constipation   . Abdominal pain   . Chest pain   . Diarrhea   . Generalized headaches   . Weakness   . Easy bruising   . Abdominal pain, periumbilical     Past Surgical History  Procedure Laterality Date  . Laparoscopic cholecystectomy w/ cholangiography  07/12/11  . Cholecystectomy  07/2011    Social History  Substance Use Topics  . Smoking status: Never Smoker   . Smokeless tobacco: Never Used  . Alcohol Use: Yes     Comment: social    Family History  Problem Relation Age of Onset  . Diabetes Father   . Gallbladder disease Father   . Liver disease Father   . Breast cancer Maternal Grandmother   . Cancer Maternal Grandmother     breast  . Lung cancer Maternal Grandfather   . Colon cancer Neg Hx   . Cancer Paternal Grandmother     breast  . Cancer Paternal Grandfather     lung    No Known Allergies  Medication list has been reviewed and updated.  Current Outpatient Prescriptions on File Prior to Visit  Medication Sig Dispense Refill  . metFORMIN (GLUCOPHAGE) 500 MG tablet Take 500 mg by mouth daily.    Marland Kitchen  penicillin v potassium (VEETID) 500 MG tablet Take 1 tablet (500 mg total) by mouth 4 (four) times daily. 40 tablet 0  . Prenatal Vit-Fe Fumarate-FA (PRENATAL VITAMIN PO) Take by mouth.     No current facility-administered medications on file prior to visit.    Review of Systems:  As per HPI- otherwise negative.   Physical Examination: Filed Vitals:   11/03/15 1144  BP: 118/68  Pulse: 98  Temp: 98.9 F (37.2 C)  Resp: 16   Filed Vitals:   11/03/15 1144  Height: 5\' 4"  (1.626 m)  Weight: 146 lb (66.225 kg)   Body mass index is 25.05 kg/(m^2). Ideal Body Weight: Weight in (lb) to have BMI = 25: 145.3  GEN: WDWN, NAD, Non-toxic, A & O x 3, looks well HEENT: Atraumatic, Normocephalic. Neck supple. No masses, No LAD.  Bilateral TM wnl, oropharynx normal.   PEERL,EOMI.   Ears and Nose: No external deformity. CV: RRR, No M/G/R. No JVD. No thrill. No extra heart sounds. PULM: CTA B, no wheezes, crackles, rhonchi. No retractions. No resp. distress. No accessory muscle use. EXTR: No c/c/e NEURO Normal gait.  PSYCH: Normally interactive. Conversant. Not depressed or anxious appearing.  Calm demeanor.   Results for orders placed or performed in visit on 11/03/15  POCT Influenza A/B  Result Value Ref Range   Influenza A, POC Negative Negative   Influenza B, POC Negative Negative    Assessment and Plan: Low grade fever - Plan: POCT Influenza A/B  Cough  Wheezing - Plan: albuterol (PROVENTIL HFA;VENTOLIN HFA) 108 (90 Base) MCG/ACT inhaler  Viral URI with cough  Here today with what seems to be a viral illness. [name] feels that she is not all that sick but as she is pregnant she wanted to be sure all is well.  She will monitor her symptoms at home and knows to call if any concern rx for albuterol to use in case of further wheezing- [facility] who confirmed this is ok to use at her stage of gestation  Signed Abbe AmsterdamJessica Copland, MD

## 2015-11-03 NOTE — Patient Instructions (Addendum)
Your flu test was negative today Continue to take the penicillin For the time being you likely have a viral URI You can use the albuterol as needed for wheezing- if you are not wheezing you do NOT have to use it however

## 2015-11-05 ENCOUNTER — Encounter: Payer: Self-pay | Admitting: Nurse Practitioner

## 2015-11-05 ENCOUNTER — Ambulatory Visit (INDEPENDENT_AMBULATORY_CARE_PROVIDER_SITE_OTHER): Payer: BLUE CROSS/BLUE SHIELD | Admitting: Nurse Practitioner

## 2015-11-05 ENCOUNTER — Other Ambulatory Visit (INDEPENDENT_AMBULATORY_CARE_PROVIDER_SITE_OTHER): Payer: BLUE CROSS/BLUE SHIELD

## 2015-11-05 ENCOUNTER — Telehealth: Payer: Self-pay | Admitting: Internal Medicine

## 2015-11-05 VITALS — BP 108/62 | HR 96 | Temp 98.1°F | Ht 64.0 in | Wt 146.5 lb

## 2015-11-05 DIAGNOSIS — R05 Cough: Secondary | ICD-10-CM

## 2015-11-05 DIAGNOSIS — R059 Cough, unspecified: Secondary | ICD-10-CM

## 2015-11-05 LAB — CBC WITH DIFFERENTIAL/PLATELET
BASOS PCT: 0.4 % (ref 0.0–3.0)
Basophils Absolute: 0 10*3/uL (ref 0.0–0.1)
EOS PCT: 0 % (ref 0.0–5.0)
Eosinophils Absolute: 0 10*3/uL (ref 0.0–0.7)
HEMATOCRIT: 37.7 % (ref 36.0–46.0)
HEMOGLOBIN: 13.3 g/dL (ref 12.0–15.0)
LYMPHS PCT: 14.4 % (ref 12.0–46.0)
Lymphs Abs: 1 10*3/uL (ref 0.7–4.0)
MCHC: 35.2 g/dL (ref 30.0–36.0)
MCV: 94.7 fl (ref 78.0–100.0)
MONOS PCT: 9.9 % (ref 3.0–12.0)
Monocytes Absolute: 0.7 10*3/uL (ref 0.1–1.0)
NEUTROS ABS: 5.1 10*3/uL (ref 1.4–7.7)
Neutrophils Relative %: 75.3 % (ref 43.0–77.0)
PLATELETS: 222 10*3/uL (ref 150.0–400.0)
RBC: 3.98 Mil/uL (ref 3.87–5.11)
RDW: 11.9 % (ref 11.5–15.5)
WBC: 6.8 10*3/uL (ref 4.0–10.5)

## 2015-11-05 LAB — BASIC METABOLIC PANEL
BUN: 6 mg/dL (ref 6–23)
CALCIUM: 9 mg/dL (ref 8.4–10.5)
CO2: 26 mEq/L (ref 19–32)
CREATININE: 0.56 mg/dL (ref 0.40–1.20)
Chloride: 99 mEq/L (ref 96–112)
GFR: 136.96 mL/min (ref >60.00)
GLUCOSE: 65 mg/dL — AB (ref 70–99)
POTASSIUM: 4.2 meq/L (ref 3.5–5.1)
Sodium: 133 mEq/L — ABNORMAL LOW (ref 135–145)

## 2015-11-05 NOTE — Patient Instructions (Signed)
Please get your labs and continue the conservative treatments: tylenol, inhaler, -Can use flonase over the counter for nasal spray (2 sprays in each nostril daily).   Lots of water and rest  We will call with results of labs

## 2015-11-05 NOTE — Assessment & Plan Note (Signed)
Symptoms not improving after 2 visits to urgent care Checking CBC w/ diff to r/out infection BMET to check her electrolytes  Continue current regimen and we will follow up after labs received.

## 2015-11-05 NOTE — Progress Notes (Signed)
Pre visit review using our clinic review tool, if applicable. No additional management support is needed unless otherwise documented below in the visit note. 

## 2015-11-05 NOTE — Progress Notes (Addendum)
Patient ID: TRACYE SZUCH, female    DOB: 04-14-1986  Age: 30 y.o. MRN: [id number]  CC: Cough and Nasal Congestion   HPI MAELY CLEMENTS presents for follow up from Urgent Care visit on 11/03/15.   1) [facility]- 11/03/15 visit Normal VS Negative Flu test at the urgent care Was on PCN and told to continue Albuterol prn wheezing  Not feeling improved  Pregnant, OB office told her to return to care or possible bacterial infections Pt reports symptoms of chest congestion, fever of 101.3 before tylenol and normal once tylenol is on board.    History Oluwatobi has a past medical history of Acute tonsillitis (09/13/2008); ALLERGIC RHINITIS (02/07/2008); MIGRAINE HEADACHE (02/03/2010); SPRAIN&STRAIN OTH SPEC SITES SHOULDER&UPPER ARM (02/03/2010); Weight loss, unintentional; Chest pain; Nausea and vomiting; Constipation; Abdominal pain; Chest pain; Diarrhea; Generalized headaches; Weakness; Easy bruising; and Abdominal pain, periumbilical.   She has past surgical history that includes Laparoscopic cholecystectomy w/ cholangiography (07/12/11) and Cholecystectomy (07/2011).   Her family history includes Breast cancer in her maternal grandmother; Cancer in her maternal grandmother, paternal grandfather, and paternal grandmother; Diabetes in her father; Gallbladder disease in her father; Liver disease in her father; Lung cancer in her maternal grandfather. There is no history of Colon cancer.She reports that she has never smoked. She has never used smokeless tobacco. She reports that she drinks alcohol. She reports that she does not use illicit drugs.  Outpatient Prescriptions Prior to Visit  Medication Sig Dispense Refill  . albuterol (PROVENTIL HFA;VENTOLIN HFA) 108 (90 Base) MCG/ACT inhaler Inhale 2 puffs into the lungs every 6 (six) hours as needed for wheezing or shortness of breath. 1 Inhaler 0  . metFORMIN (GLUCOPHAGE) 500 MG tablet Take 500 mg by mouth daily.    . penicillin v potassium (VEETID) 500 MG  tablet Take 1 tablet (500 mg total) by mouth 4 (four) times daily. 40 tablet 0  . Prenatal Vit-Fe Fumarate-FA (PRENATAL VITAMIN PO) Take by mouth.     No facility-administered medications prior to visit.    ROS Review of Systems  Constitutional: Negative for fever, chills, diaphoresis and fatigue.  Respiratory: Negative for chest tightness, shortness of breath and wheezing.   Cardiovascular: Negative for chest pain, palpitations and leg swelling.  Gastrointestinal: Negative for nausea, vomiting and diarrhea.  Skin: Negative for rash.  Neurological: Negative for dizziness, weakness, numbness and headaches.  Psychiatric/Behavioral: The patient is not nervous/anxious.     Objective:  BP 108/62 mmHg  Pulse 96  Temp(Src) 98.1 F (36.7 C) (Oral)  Ht  (1.626 m)  Wt 146 lb 8 oz (66.452 kg)  BMI 25.13 kg/m2  SpO2 96%  Physical Exam  Constitutional: She is oriented to person, place, and time. She appears well-developed and well-nourished. No distress.  HENT:  Head: Normocephalic and atraumatic.  Right Ear: External ear normal.  Left Ear: External ear normal.  Mouth/Throat: Oropharynx is clear and moist. No oropharyngeal exudate.  Tonsil stone on left, no exudates  Eyes: Pupils are equal, round, and reactive to light. Right eye exhibits no discharge. Left eye exhibits no discharge. No scleral icterus.  Neck: Normal range of motion. Neck supple.  Cardiovascular: Normal rate, regular rhythm and normal heart sounds.  Exam reveals no gallop and no friction rub.   No murmur heard. Pulmonary/Chest: Effort normal and breath sounds normal. No respiratory distress. She has no wheezes. She has no rales. She exhibits no tenderness.  Lymphadenopathy:    She has no cervical adenopathy.  Neurological: She  is alert and oriented to person, place, and time. No cranial nerve deficit. She exhibits normal muscle tone. Coordination normal.  Skin: Skin is warm and dry. No rash noted. She is not  diaphoretic.  Psychiatric: She has a normal mood and affect. Her behavior is normal. Judgment and thought content normal.   Assessment & Plan:   Annalee was seen today for cough and nasal congestion.  Diagnoses and all orders for this visit:  Cough -     CBC w/Diff; Future -     Basic Metabolic Panel (BMET); Future  I am having Ms. Mcdaniel maintain her metFORMIN, Prenatal Vit-Fe Fumarate-FA (PRENATAL VITAMIN PO), penicillin v potassium, albuterol, Dextromethorphan Polistirex (DELSYM PO), acetaminophen, and (Doxylamine Succinate, Sleep, (UNISOM PO)).  Meds ordered this encounter  Medications  . Dextromethorphan Polistirex (DELSYM PO)    Sig: Take by mouth.  Marland Kitchen acetaminophen (TYLENOL) 500 MG tablet    Sig: Take 500 mg by mouth every 6 (six) hours as needed.  . Doxylamine Succinate, Sleep, (UNISOM PO)    Sig: Take by mouth.     Follow-up: Return if symptoms worsen or fail to improve.

## 2015-11-05 NOTE — Telephone Encounter (Signed)
Pt called regarding lab results she had done today. I read them to her but she still would like to talk with someone. Her fever has spiked again this afternoon and since she is pregnant, she is worried  Please give her at [phone]

## 2015-11-06 NOTE — Telephone Encounter (Signed)
Pt called back and she has been informed of results and recommendation.   Pt stated that her fever spiked to 102.5. She was able to get it down to 101 after taking tylenol and a cold shower. Pt stated that she woke up with a normal temp and drenched in sweat. Pt agreed to follow all recommendations and to inform OBGYN of results.

## 2015-11-06 NOTE — Telephone Encounter (Signed)
Forwarding message to provider that ordered labs yesterday.  LVM for pt to call back as soon as possible.  RE: lab results are resulted.

## 2015-11-06 NOTE — Telephone Encounter (Signed)
Since I am not there today. I don't see any reason (bacterial) that this is happening. She really needs to talk with her OB.GYN and let them know she is negative for these findings.

## 2015-11-21 LAB — OB RESULTS CONSOLE HIV ANTIBODY (ROUTINE TESTING): HIV: NONREACTIVE

## 2015-11-21 LAB — OB RESULTS CONSOLE GC/CHLAMYDIA
Chlamydia: NEGATIVE
Gonorrhea: NEGATIVE

## 2015-11-21 LAB — OB RESULTS CONSOLE ABO/RH: RH Type: POSITIVE

## 2015-11-21 LAB — OB RESULTS CONSOLE RUBELLA ANTIBODY, IGM: Rubella: IMMUNE

## 2015-11-21 LAB — OB RESULTS CONSOLE RPR: RPR: NONREACTIVE

## 2015-11-21 LAB — OB RESULTS CONSOLE ANTIBODY SCREEN: Antibody Screen: NEGATIVE

## 2015-11-21 LAB — OB RESULTS CONSOLE HEPATITIS B SURFACE ANTIGEN: Hepatitis B Surface Ag: NEGATIVE

## 2016-01-22 DIAGNOSIS — Z3482 Encounter for supervision of other normal pregnancy, second trimester: Secondary | ICD-10-CM | POA: Diagnosis not present

## 2016-01-22 DIAGNOSIS — Z3A21 21 weeks gestation of pregnancy: Secondary | ICD-10-CM | POA: Diagnosis not present

## 2016-02-18 DIAGNOSIS — N898 Other specified noninflammatory disorders of vagina: Secondary | ICD-10-CM | POA: Diagnosis not present

## 2016-02-18 DIAGNOSIS — Z3482 Encounter for supervision of other normal pregnancy, second trimester: Secondary | ICD-10-CM | POA: Diagnosis not present

## 2016-02-18 DIAGNOSIS — Z3A25 25 weeks gestation of pregnancy: Secondary | ICD-10-CM | POA: Diagnosis not present

## 2016-03-02 DIAGNOSIS — O4702 False labor before 37 completed weeks of gestation, second trimester: Secondary | ICD-10-CM | POA: Diagnosis not present

## 2016-03-02 DIAGNOSIS — Z3A26 26 weeks gestation of pregnancy: Secondary | ICD-10-CM | POA: Diagnosis not present

## 2016-03-11 DIAGNOSIS — Z23 Encounter for immunization: Secondary | ICD-10-CM | POA: Diagnosis not present

## 2016-03-11 DIAGNOSIS — Z36 Encounter for antenatal screening of mother: Secondary | ICD-10-CM | POA: Diagnosis not present

## 2016-04-08 DIAGNOSIS — R51 Headache: Secondary | ICD-10-CM | POA: Diagnosis not present

## 2016-04-09 DIAGNOSIS — R51 Headache: Secondary | ICD-10-CM | POA: Diagnosis not present

## 2016-05-11 DIAGNOSIS — Z36 Encounter for antenatal screening of mother: Secondary | ICD-10-CM | POA: Diagnosis not present

## 2016-05-11 LAB — OB RESULTS CONSOLE GBS: GBS: NEGATIVE

## 2016-05-17 DIAGNOSIS — O139 Gestational [pregnancy-induced] hypertension without significant proteinuria, unspecified trimester: Secondary | ICD-10-CM | POA: Diagnosis not present

## 2016-05-28 ENCOUNTER — Inpatient Hospital Stay (HOSPITAL_COMMUNITY): Payer: BLUE CROSS/BLUE SHIELD | Admitting: Anesthesiology

## 2016-05-28 ENCOUNTER — Encounter (HOSPITAL_COMMUNITY): Payer: Self-pay | Admitting: Anesthesiology

## 2016-05-28 ENCOUNTER — Telehealth (HOSPITAL_COMMUNITY): Payer: Self-pay | Admitting: *Deleted

## 2016-05-28 ENCOUNTER — Encounter (HOSPITAL_COMMUNITY): Payer: Self-pay | Admitting: *Deleted

## 2016-05-28 ENCOUNTER — Inpatient Hospital Stay (HOSPITAL_COMMUNITY)
Admission: AD | Admit: 2016-05-28 | Discharge: 2016-05-31 | DRG: 775 | Disposition: A | Discharge status: Home / Self Care | Payer: BLUE CROSS/BLUE SHIELD | Source: Ambulatory Visit | Attending: Obstetrics and Gynecology | Admitting: Obstetrics and Gynecology

## 2016-05-28 DIAGNOSIS — Z833 Family history of diabetes mellitus: Secondary | ICD-10-CM | POA: Diagnosis not present

## 2016-05-28 DIAGNOSIS — Z803 Family history of malignant neoplasm of breast: Secondary | ICD-10-CM | POA: Diagnosis not present

## 2016-05-28 DIAGNOSIS — Z3A39 39 weeks gestation of pregnancy: Secondary | ICD-10-CM | POA: Diagnosis not present

## 2016-05-28 DIAGNOSIS — Z3483 Encounter for supervision of other normal pregnancy, third trimester: Secondary | ICD-10-CM | POA: Diagnosis present

## 2016-05-28 LAB — CBC
HCT: 31.4 % — ABNORMAL LOW (ref 36.0–46.0)
Hemoglobin: 11.7 g/dL — ABNORMAL LOW (ref 12.0–15.0)
MCH: 34.4 pg — ABNORMAL HIGH (ref 26.0–34.0)
MCHC: 37.3 g/dL — ABNORMAL HIGH (ref 30.0–36.0)
MCV: 92.4 fL (ref 78.0–100.0)
PLATELETS: 230 10*3/uL (ref 150–400)
RBC: 3.4 MIL/uL — ABNORMAL LOW (ref 3.87–5.11)
RDW: 13.2 % (ref 11.5–15.5)
WBC: 7.7 10*3/uL (ref 4.0–10.5)

## 2016-05-28 LAB — ABO/RH: ABO/RH(D): B POS

## 2016-05-28 LAB — TYPE AND SCREEN
ABO/RH(D): B POS
Antibody Screen: NEGATIVE

## 2016-05-28 MED ORDER — PHENYLEPHRINE 40 MCG/ML (10ML) SYRINGE FOR IV PUSH (FOR BLOOD PRESSURE SUPPORT)
80.0000 ug | PREFILLED_SYRINGE | INTRAVENOUS | Status: DC | PRN
  Filled 2016-05-28: qty 10

## 2016-05-28 MED ORDER — LACTATED RINGERS IV SOLN: 500.0000 mL | INTRAVENOUS | Status: DC | PRN

## 2016-05-28 MED ORDER — OXYTOCIN 40 UNITS IN LACTATED RINGERS INFUSION - SIMPLE MED
2.5000 [IU]/h | INTRAVENOUS | Status: DC
  Administered 2016-05-29: 2.5 [IU]/h via INTRAVENOUS
  Filled 2016-05-28: qty 1000

## 2016-05-28 MED ORDER — OXYTOCIN BOLUS FROM INFUSION
500.0000 mL | Freq: Once | INTRAVENOUS | Status: DC
  Administered 2016-05-29: 500 mL via INTRAVENOUS

## 2016-05-28 MED ORDER — LACTATED RINGERS IV SOLN
INTRAVENOUS | Status: DC
  Administered 2016-05-28: 17:00:00 via INTRAVENOUS

## 2016-05-28 MED ORDER — OXYTOCIN 40 UNITS IN LACTATED RINGERS INFUSION - SIMPLE MED
1.0000 m[IU]/min | INTRAVENOUS | Status: DC
  Administered 2016-05-28: 2 m[IU]/min via INTRAVENOUS

## 2016-05-28 MED ORDER — EPHEDRINE 5 MG/ML INJ: 10.0000 mg | INTRAVENOUS | Status: DC | PRN

## 2016-05-28 MED ORDER — FENTANYL 2.5 MCG/ML BUPIVACAINE 1/10 % EPIDURAL INFUSION (WH - ANES)
14.0000 mL/h | INTRAMUSCULAR | Status: DC | PRN
  Administered 2016-05-28 – 2016-05-29 (×2): 14 mL/h via EPIDURAL
  Filled 2016-05-28 (×2): qty 125

## 2016-05-28 MED ORDER — ONDANSETRON HCL 4 MG/2ML IJ SOLN
4.0000 mg | Freq: Four times a day (QID) | INTRAMUSCULAR | Status: DC | PRN
  Administered 2016-05-29: 4 mg via INTRAVENOUS
  Filled 2016-05-28: qty 2

## 2016-05-28 MED ORDER — LIDOCAINE HCL (PF) 1 % IJ SOLN: 30.0000 mL | INTRAMUSCULAR | Status: DC | PRN

## 2016-05-28 MED ORDER — SOD CITRATE-CITRIC ACID 500-334 MG/5ML PO SOLN
30.0000 mL | ORAL | Status: DC | PRN
Start: 2016-05-28 — End: 2016-05-29
  Administered 2016-05-28 – 2016-05-29 (×2): 30 mL via ORAL
  Filled 2016-05-28 (×2): qty 15

## 2016-05-28 MED ORDER — LIDOCAINE HCL (PF) 1 % IJ SOLN
INTRAMUSCULAR | Status: DC | PRN
  Administered 2016-05-28: 7 mL via EPIDURAL
  Administered 2016-05-28: 6 mL via EPIDURAL

## 2016-05-28 MED ORDER — PHENYLEPHRINE 40 MCG/ML (10ML) SYRINGE FOR IV PUSH (FOR BLOOD PRESSURE SUPPORT): 80.0000 ug | PREFILLED_SYRINGE | INTRAVENOUS | Status: DC | PRN

## 2016-05-28 MED ORDER — TERBUTALINE SULFATE 1 MG/ML IJ SOLN: 0.2500 mg | Freq: Once | INTRAMUSCULAR | Status: DC | PRN

## 2016-05-28 MED ORDER — OXYCODONE-ACETAMINOPHEN 5-325 MG PO TABS: 1.0000 | ORAL_TABLET | ORAL | Status: DC | PRN

## 2016-05-28 MED ORDER — ACETAMINOPHEN 325 MG PO TABS
650.0000 mg | ORAL_TABLET | ORAL | Status: DC | PRN
  Administered 2016-05-28: 650 mg via ORAL
  Filled 2016-05-28: qty 2

## 2016-05-28 MED ORDER — OXYCODONE-ACETAMINOPHEN 5-325 MG PO TABS: 2.0000 | ORAL_TABLET | ORAL | Status: DC | PRN

## 2016-05-28 MED ORDER — DIPHENHYDRAMINE HCL 50 MG/ML IJ SOLN: 12.5000 mg | INTRAMUSCULAR | Status: DC | PRN

## 2016-05-28 MED ORDER — LACTATED RINGERS IV SOLN
500.0000 mL | Freq: Once | INTRAVENOUS | Status: AC
  Administered 2016-05-28: 500 mL via INTRAVENOUS

## 2016-05-28 NOTE — H&P (Signed)
Alicia Jones is a 30 y.o. female G2P0010 at 39+ for IOL, favorable  cervic at term with irregular painful contractions.  Relatively uncomplicated PNC.    POBGYNHx: G2P0010 G1 SAB G2 present +abn pap, colpo; nl since.  Last 2015 WNL No STD  Past Medical History:  Diagnosis Date  . Abdominal pain   . Abdominal pain, periumbilical   . Acute tonsillitis 09/13/2008  . ALLERGIC RHINITIS 02/07/2008  . Chest pain   . Chest pain   . Constipation   . Diarrhea   . Easy bruising   . Generalized headaches   . MIGRAINE HEADACHE 02/03/2010  . Nausea and vomiting   . SPRAIN&STRAIN OTH SPEC SITES SHOULDER&UPPER ARM 02/03/2010  . Weakness   . Weight loss, unintentional   PCOS, Depression  Past Surgical History:  Procedure Laterality Date  . CHOLECYSTECTOMY  07/2011  . LAPAROSCOPIC CHOLECYSTECTOMY W/ CHOLANGIOGRAPHY  07/12/11   Family History: family history includes Breast cancer in her maternal grandmother; Cancer in her maternal grandmother, paternal grandfather, and paternal grandmother; Diabetes in her father; Gallbladder disease in her father; Liver disease in her father. Social History:  reports that she has never smoked. She has never used smokeless tobacco. She reports that she drinks alcohol. She reports that she does not use drugs. married Meds: PNV All: NKDA     Maternal Diabetes: No Genetic Screening: Declined Maternal Ultrasounds/Referrals: Normal Fetal Ultrasounds or other Referrals:  None Maternal Substance Abuse:  No Significant Maternal Medications:  None Significant Maternal Lab Results:  Lab values include: Group B Strep negative Other Comments:  None  Review of Systems  Constitutional: Negative.   HENT: Negative.   Eyes: Negative.   Respiratory: Negative.   Cardiovascular: Negative.   Gastrointestinal: Negative.   Genitourinary: Negative.   Musculoskeletal: Positive for back pain.  Skin: Negative.   Neurological: Negative.   Psychiatric/Behavioral:  Negative.    Maternal Medical History:  Reason for admission: Contractions.   Contractions: Frequency: irregular.    Fetal activity: Perceived fetal activity is normal.    Prenatal Complications - Diabetes: none.    Dilation: 3 Effacement (%): 70 Station: -2 Exam by:: Dr Ellyn HackBovard Blood pressure 120/66, pulse 71, temperature 98.6 F (37 C), temperature source Oral, resp. rate 18, height 5\' 3"  (1.6 m), weight 79.4 kg (175 lb), last menstrual period 08/27/2015. Maternal Exam:  Uterine Assessment: Contraction frequency is irregular.   Abdomen: Patient reports no abdominal tenderness. Fundal height is appropriate for gestation.   Estimated fetal weight is 7-8#.   Fetal presentation: vertex  Introitus: Normal vulva. Normal vagina.  Pelvis: adequate for delivery.   Cervix: Cervix evaluated by digital exam.     Physical Exam  Constitutional: She is oriented to person, place, and time. She appears well-developed and well-nourished.  HENT:  Head: Normocephalic and atraumatic.  Cardiovascular: Normal rate and regular rhythm.   Respiratory: Effort normal and breath sounds normal. No respiratory distress. She has no wheezes.  GI: Soft. Bowel sounds are normal. She exhibits no distension. There is no tenderness.  Musculoskeletal: Normal range of motion.  Neurological: She is alert and oriented to person, place, and time.  Skin: Skin is warm and dry.  Psychiatric: She has a normal mood and affect. Her behavior is normal.    Prenatal labs: ABO, Rh: --/--/B POS (08/11 1655) Antibody: NEG (08/11 1655) Rubella: Immune (02/03 0000) RPR: Nonreactive (02/03 0000)  HBsAg: Negative (02/03 0000)  HIV: Non-reactive (02/03 0000)  GBS: Negative (07/25 0000)   Flu  2/3, Tdap 5/25  Hgb 12.5/Plt 288/Ur Cx neg/GC neg/Chl neg/Varicella immune/CF neg/glucola 118  Nl anat, ant plac, female  Assessment/Plan: 30yo G2P0010 at 39+ for IOL, favorable at term Pitocin and AROM to augment Epidural  prn Expect SVD   Bovard-Stuckert, Mcadoo Muzquiz 05/28/2016, 7:11 PM

## 2016-05-28 NOTE — Anesthesia Procedure Notes (Signed)
Epidural Patient location during procedure: OB Start time: 05/28/2016 8:22 PM End time: 05/28/2016 8:26 PM  Staffing Anesthesiologist: Leilani AbleHATCHETT, Quinlin Conant Performed: anesthesiologist   Preanesthetic Checklist Completed: patient identified, surgical consent, pre-op evaluation, timeout performed, IV checked, risks and benefits discussed and monitors and equipment checked  Epidural Patient position: sitting Prep: site prepped and draped and DuraPrep Patient monitoring: continuous pulse ox and blood pressure Approach: midline Location: L3-L4 Injection technique: LOR air  Needle:  Needle type: Tuohy  Needle gauge: 17 G Needle length: 9 cm and 9 Needle insertion depth: 5 cm cm Catheter type: closed end flexible Catheter size: 19 Gauge Catheter at skin depth: 10 cm Test dose: negative and Other  Assessment Sensory level: T9 Events: blood not aspirated, injection not painful, no injection resistance, negative IV test and no paresthesia  Additional Notes Reason for block:procedure for pain

## 2016-05-28 NOTE — Telephone Encounter (Signed)
Preadmission screen  

## 2016-05-28 NOTE — Progress Notes (Signed)
Patient ID: Alicia Jones, female   DOB: 02/05/1986, 30 y.o.   MRN: [id number]  Starting to feel ctx  AFVSS gen NAD FHTs 120's, good var, category 1 toco Q 3-785min  SVE 4/80/-1 ROM for clear fluid w/o diff/comp  Continue current mgmt

## 2016-05-28 NOTE — Anesthesia Preprocedure Evaluation (Signed)
Anesthesia Evaluation  Patient identified by MRN, date of birth, ID band Patient awake    Reviewed: Allergy & Precautions, H&P , NPO status , Patient's Chart, lab work & pertinent test results  Airway Mallampati: I  TM Distance: >3 FB Neck ROM: full    Dental no notable dental hx.    Pulmonary neg pulmonary ROS,    Pulmonary exam normal        Cardiovascular negative cardio ROS Normal cardiovascular exam     Neuro/Psych negative psych ROS   GI/Hepatic negative GI ROS, Neg liver ROS,   Endo/Other  negative endocrine ROS  Renal/GU negative Renal ROS     Musculoskeletal   Abdominal   Peds  Hematology negative hematology ROS (+)   Anesthesia Other Findings   Reproductive/Obstetrics (+) Pregnancy                             Anesthesia Physical Anesthesia Plan  ASA: II  Anesthesia Plan: Epidural   Post-op Pain Management:    Induction:   Airway Management Planned:   Additional Equipment:   Intra-op Plan:   Post-operative Plan:   Informed Consent: I have reviewed the patients History and Physical, chart, labs and discussed the procedure including the risks, benefits and alternatives for the proposed anesthesia with the patient or authorized representative who has indicated his/her understanding and acceptance.     Plan Discussed with:   Anesthesia Plan Comments:         Anesthesia Quick Evaluation

## 2016-05-28 NOTE — Anesthesia Pain Management Evaluation Note (Signed)
  CRNA Pain Management Visit Note  Patient: Alicia Jones, 30 y.o., female  "Hello I am a [facility] of the anesthesia [facility]. We have an anesthesia team available at all times to provide care throughout the hospital, including epidural management and anesthesia for C-section. I don't know your plan for the delivery whether it a natural birth, water birth, IV sedation, nitrous supplementation, doula or epidural, but we want to meet your pain goals."   1.Was your pain managed to your expectations on prior hospitalizations?   No prior hospitalizations  2.What is your expectation for pain management during this hospitalization?     Epidural  3.How can we help you reach that goal? About to ge epidural  Record the patient's initial score and the patient's pain goal.   Pain: 2  Pain Goal: 9 [facility] wants you to be able to say your pain was always managed very well.  Carmeron Heady 05/28/2016

## 2016-05-29 ENCOUNTER — Encounter (HOSPITAL_COMMUNITY): Payer: Self-pay | Admitting: Obstetrics and Gynecology

## 2016-05-29 LAB — RPR: RPR Ser Ql: NONREACTIVE

## 2016-05-29 MED ORDER — ALBUTEROL SULFATE (2.5 MG/3ML) 0.083% IN NEBU: 3.0000 mL | INHALATION_SOLUTION | Freq: Four times a day (QID) | RESPIRATORY_TRACT | Status: DC | PRN

## 2016-05-29 MED ORDER — SENNOSIDES-DOCUSATE SODIUM 8.6-50 MG PO TABS
2.0000 | ORAL_TABLET | ORAL | Status: DC
  Administered 2016-05-30 – 2016-05-31 (×2): 2 via ORAL
  Filled 2016-05-29 (×2): qty 2

## 2016-05-29 MED ORDER — COCONUT OIL OIL
1.0000 "application " | TOPICAL_OIL | Status: DC | PRN
  Administered 2016-05-29: 1 via TOPICAL
  Filled 2016-05-29: qty 120

## 2016-05-29 MED ORDER — DIPHENHYDRAMINE HCL 25 MG PO CAPS: 25.0000 mg | ORAL_CAPSULE | Freq: Four times a day (QID) | ORAL | Status: DC | PRN

## 2016-05-29 MED ORDER — OXYCODONE HCL 5 MG PO TABS
5.0000 mg | ORAL_TABLET | ORAL | Status: DC | PRN
  Administered 2016-05-29 – 2016-05-31 (×6): 5 mg via ORAL
  Filled 2016-05-29 (×5): qty 1

## 2016-05-29 MED ORDER — SIMETHICONE 80 MG PO CHEW: 80.0000 mg | CHEWABLE_TABLET | ORAL | Status: DC | PRN

## 2016-05-29 MED ORDER — LACTATED RINGERS IV SOLN: INTRAVENOUS | Status: DC

## 2016-05-29 MED ORDER — IBUPROFEN 600 MG PO TABS
600.0000 mg | ORAL_TABLET | Freq: Four times a day (QID) | ORAL | Status: DC
  Administered 2016-05-29 – 2016-05-31 (×10): 600 mg via ORAL
  Filled 2016-05-29 (×10): qty 1

## 2016-05-29 MED ORDER — WITCH HAZEL-GLYCERIN EX PADS
1.0000 "application " | MEDICATED_PAD | CUTANEOUS | Status: DC | PRN
  Administered 2016-05-30 – 2016-05-31 (×2): 1 via TOPICAL

## 2016-05-29 MED ORDER — FLUOXETINE HCL 20 MG PO TABS
20.0000 mg | ORAL_TABLET | Freq: Every day | ORAL | Status: DC
  Filled 2016-05-29 (×2): qty 1

## 2016-05-29 MED ORDER — DIBUCAINE 1 % RE OINT
1.0000 "application " | TOPICAL_OINTMENT | RECTAL | Status: DC | PRN
  Administered 2016-05-30: 1 via RECTAL
  Filled 2016-05-29: qty 28

## 2016-05-29 MED ORDER — BENZOCAINE-MENTHOL 20-0.5 % EX AERO
1.0000 "application " | INHALATION_SPRAY | CUTANEOUS | Status: DC | PRN
  Administered 2016-05-29 – 2016-05-31 (×2): 1 via TOPICAL
  Filled 2016-05-29 (×2): qty 56

## 2016-05-29 MED ORDER — PRENATAL MULTIVITAMIN CH
1.0000 | ORAL_TABLET | Freq: Every day | ORAL | Status: DC
  Administered 2016-05-29 – 2016-05-31 (×3): 1 via ORAL
  Filled 2016-05-29 (×3): qty 1

## 2016-05-29 MED ORDER — ONDANSETRON HCL 4 MG/2ML IJ SOLN: 4.0000 mg | INTRAMUSCULAR | Status: DC | PRN

## 2016-05-29 MED ORDER — FLUOXETINE HCL 20 MG PO CAPS
20.0000 mg | ORAL_CAPSULE | Freq: Every day | ORAL | Status: DC
  Administered 2016-05-29 – 2016-05-31 (×2): 20 mg via ORAL
  Filled 2016-05-29 (×2): qty 1

## 2016-05-29 MED ORDER — ONDANSETRON HCL 4 MG PO TABS: 4.0000 mg | ORAL_TABLET | ORAL | Status: DC | PRN

## 2016-05-29 MED ORDER — OXYCODONE HCL 5 MG PO TABS
10.0000 mg | ORAL_TABLET | ORAL | Status: DC | PRN
  Filled 2016-05-29: qty 2

## 2016-05-29 MED ORDER — ACETAMINOPHEN 325 MG PO TABS
650.0000 mg | ORAL_TABLET | ORAL | Status: DC | PRN
  Administered 2016-05-29 – 2016-05-31 (×5): 650 mg via ORAL
  Filled 2016-05-29 (×5): qty 2

## 2016-05-29 MED ORDER — ZOLPIDEM TARTRATE 5 MG PO TABS: 5.0000 mg | ORAL_TABLET | Freq: Every evening | ORAL | Status: DC | PRN

## 2016-05-29 NOTE — Progress Notes (Signed)
Patient ID: Alicia Jones, female   DOB: 02/21/1986, 30 y.o.   MRN: [id number]  Comfortable with epidural  AFVSS gen NAD FHTs 120's, mod var, category 1 toco Q 2min  SVE 6.5/90/-1 per RN  Coninue current mgmt, expect SVD

## 2016-05-29 NOTE — Lactation Note (Signed)
This note was copied from a baby'Jones chart. Lactation Consultation Note  Patient Name: Alicia Jones ZOXWR'UToday'Jones Date: 05/29/2016 Reason for consult: Initial assessment Initial visit made.  Breastfeeding consultation services and support information given and reviewed.  Mom currently has baby latched well to breast and baby nursing with good nutritive sucks.  Reviewed basics and encouraged to feed with any feeding cue.  Instructed on good breast massage during feeding.  Encouraged to call with concerns/assist.  Maternal Data Has patient been taught Hand Expression?: Yes Does the patient have breastfeeding experience prior to this delivery?: No  Feeding Feeding Type: Breast Fed Length of feed: 30 min  LATCH Score/Interventions Latch: Grasps breast easily, tongue down, lips flanged, rhythmical sucking.  Audible Swallowing: Spontaneous and intermittent Intervention(Jones): Skin to skin  Type of Nipple: Everted at rest and after stimulation  Comfort (Breast/Nipple): Soft / non-tender     Hold (Positioning): No assistance needed to correctly position infant at breast. Intervention(Jones): Breastfeeding basics reviewed;Support Pillows;Skin to skin  LATCH Score: 10  Lactation Tools Discussed/Used     Consult Status Consult Status: Follow-up Date: 05/30/16 Follow-up type: In-patient    Huston FoleyMOULDEN, Alicia Jones 05/29/2016, 3:47 PM

## 2016-05-29 NOTE — Anesthesia Postprocedure Evaluation (Signed)
Anesthesia Post Note  Patient: Alicia Jones  Procedure(s) Performed: * No procedures listed *  Patient location during evaluation: Mother Baby Anesthesia Type: Epidural Level of consciousness: awake, awake and alert, oriented and patient cooperative Pain management: pain level controlled Vital Signs Assessment: post-procedure vital signs reviewed and stable Respiratory status: spontaneous breathing, nonlabored ventilation and respiratory function stable Cardiovascular status: stable Postop Assessment: no headache, no backache, no signs of nausea or vomiting and patient able to bend at knees Anesthetic complications: no     Last Vitals:  Vitals:   05/29/16 0602 05/29/16 0645  BP: (!) 143/76 111/66  Pulse: 87 71  Resp:  18  Temp:  36.4 C    Last Pain:  Vitals:   05/29/16 0645  TempSrc: Oral  PainSc: 4    Pain Goal:                 Alicia Jones

## 2016-05-29 NOTE — Progress Notes (Signed)
CSW spoke with pt re: consult for hx of depression.  Pt admits that her MD noticed some s/s of depression late in her pregnancy and started her on Prozac.  Pt plans on continuing Prozac, post-partum, with her first does being this pm.  PPD discussed.  Pt is agreeable to f/u with her MD re: any changes in mood/behavior, post-d/c.  Support group handout provided.  No other social work needs identified.  CSW signing off.  Please re-consult as necessary.  Pollyann SavoyJody Chasidy Janak, LCSW Weekend Coverage [phone]

## 2016-05-30 LAB — CBC
HEMATOCRIT: 26.7 % — AB (ref 36.0–46.0)
HEMOGLOBIN: 9.6 g/dL — AB (ref 12.0–15.0)
MCH: 33.9 pg (ref 26.0–34.0)
MCHC: 36 g/dL (ref 30.0–36.0)
MCV: 94.3 fL (ref 78.0–100.0)
Platelets: 196 10*3/uL (ref 150–400)
RBC: 2.83 MIL/uL — ABNORMAL LOW (ref 3.87–5.11)
RDW: 13.6 % (ref 11.5–15.5)
WBC: 9.7 10*3/uL (ref 4.0–10.5)

## 2016-05-30 NOTE — Progress Notes (Signed)
Post Partum Day 1 Subjective: no complaints, up ad lib, voiding, tolerating PO and nl lochia, pain controlled  Objective: Blood pressure 113/62, pulse 66, temperature 97.5 F (36.4 C), resp. rate 18, height 5\' 3"  (1.6 m), weight 79.4 kg (175 lb), last menstrual period 08/27/2015, SpO2 100 %, unknown if currently breastfeeding.  Physical Exam:  General: alert and no distress Lochia: appropriate Uterine Fundus: firm   Recent Labs  05/28/16 1655 05/30/16 0516  HGB 11.7* 9.6*  HCT 31.4* 26.7*    Assessment/Plan: Plan for discharge tomorrow, Breastfeeding and Lactation consult.  Routine care.  Circumcision for female today.     LOS: 2 days   Bovard-Stuckert, Destry Dauber 05/30/2016, 9:48 AM

## 2016-05-31 MED ORDER — OXYCODONE HCL 5 MG PO TABS: 5.0000 mg | ORAL_TABLET | ORAL | 0 refills | Status: DC | PRN

## 2016-05-31 MED ORDER — IBUPROFEN 600 MG PO TABS: 600.0000 mg | ORAL_TABLET | Freq: Four times a day (QID) | ORAL | 0 refills | Status: DC

## 2016-05-31 NOTE — Progress Notes (Signed)
The Delivery Note is a delayed entry from 8-12 Pt doing well, some cramping Afeb, VSS Fundus firm D/c home

## 2016-05-31 NOTE — Discharge Instructions (Signed)
As per discharge pamphlet °

## 2016-05-31 NOTE — Lactation Note (Signed)
This note was copied from a baby's chart. Lactation Consultation Note: mom reports baby last fed about 1 hour ago for 30 min. Reports he is latching well but her nipples are a little tender. Positional stripe noted on both nipples. Suggested EBM to nipples and changing positions. Has coconut oil. Baby asleep in bassinet. Reviewed our phone number to call with questions, OP appointments and BFSG. Going back to work part time in 4 [employer]. Has Spectra pump for home. Reviewed when to pump and offering bottles. No further questions at present. To call prn  Patient Name: Alicia Jones ZOXWR'UToday's Date: 05/31/2016 Reason for consult: Follow-up assessment   Maternal Data Formula Feeding for Exclusion: No Has patient been taught Hand Expression?: Yes Does the patient have breastfeeding experience prior to this delivery?: No  Feeding Feeding Type: Breast Fed Length of feed: 20 min  LATCH Score/Interventions                      Lactation Tools Discussed/Used WIC Program: No   Consult Status Consult Status: Complete    Pamelia HoitWeeks, Ahana Najera D 05/31/2016, 8:11 AM

## 2016-05-31 NOTE — Discharge Summary (Signed)
OB Discharge Summary     Patient Name: Alicia Jones DOB: 08/22/1986 MRN: [id number]  Date of admission: 05/28/2016 Delivering MD: Sherian ReinBOVARD-STUCKERT, JODY   Date of discharge: 05/31/2016  Admitting diagnosis: induction,39w  Intrauterine pregnancy: 844w3d     Secondary diagnosis:  Principal Problem:   SVD (spontaneous vaginal delivery) Active Problems:   Labor and delivery, indication for care      Discharge diagnosis: Term Pregnancy Delivered                                                                                                Hospital course:  Induction of Labor With Vaginal Delivery   30 y.o. yo G1P1001 at 5644w3d was admitted to the hospital 05/28/2016 for induction of labor.  Indication for induction: Favorable cervix at term.  Patient had an uncomplicated labor course as follows: Membrane Rupture Time/Date: 7:25 PM ,05/28/2016   Intrapartum Procedures: Episiotomy: None [1]                                         Lacerations:  2nd degree [3];Perineal [11]  Patient had delivery of a Viable infant.  Information for the patient's newborn:  Alicia SalenKendrick, Boy Alicia Jones [[id number]][[id number]]  Delivery Method: Vaginal, Spontaneous Delivery (Filed from Delivery Summary)   05/29/2016  Details of delivery can be found in separate delivery note.  Patient had a routine postpartum course. Patient is discharged home 05/31/16.   Physical exam Vitals:   05/29/16 1809 05/30/16 0530 05/30/16 1748 05/31/16 0535  BP: 120/73 113/62 125/78 111/71  Pulse: 79 66 72 70  Resp: 18 18 18 18   Temp: 97.4 F (36.3 C) 97.5 F (36.4 C) 98 F (36.7 C) 97.9 F (36.6 C)  TempSrc: Oral  Oral Oral  SpO2:      Weight:      Height:       General: alert Lochia: appropriate Uterine Fundus: firm  Labs: Lab Results  Component Value Date   WBC 9.7 05/30/2016   HGB 9.6 (L) 05/30/2016   HCT 26.7 (L) 05/30/2016   MCV 94.3 05/30/2016   PLT 196 05/30/2016   CMP Latest Ref Rng & Units 11/05/2015  Glucose 70 - 99  mg/dL 08(M65(L)  BUN 6 - 23 mg/dL 6  Creatinine 5.780.40 - 4.691.20 mg/dL 6.290.56  Sodium 528135 - 413145 mEq/L 133(L)  Potassium 3.5 - 5.1 mEq/L 4.2  Chloride 96 - 112 mEq/L 99  CO2 19 - 32 mEq/L 26  Calcium 8.4 - 10.5 mg/dL 9.0  Total Protein 6.0 - 8.3 g/dL -  Total Bilirubin 0.2 - 1.2 mg/dL -  Alkaline Phos 39 - 244117 U/L -  AST 0 - 37 U/L -  ALT 0 - 35 U/L -    Discharge instruction: per After Visit Summary and "Baby and Me Booklet".  After visit meds:    Medication List    STOP taking these medications   penicillin v potassium 500 MG tablet Commonly known as:  VEETID     TAKE  these medications   acetaminophen 500 MG tablet Commonly known as:  TYLENOL Take 500 mg by mouth every 6 (six) hours as needed for mild pain, moderate pain or headache.   albuterol 108 (90 Base) MCG/ACT inhaler Commonly known as:  PROVENTIL HFA;VENTOLIN HFA Inhale 2 puffs into the lungs every 6 (six) hours as needed for wheezing or shortness of breath.   FLUoxetine 20 MG tablet Commonly known as:  PROZAC Take 20 mg by mouth daily.   ibuprofen 600 MG tablet Commonly known as:  ADVIL,MOTRIN Take 1 tablet (600 mg total) by mouth every 6 (six) hours.   oxyCODONE 5 MG immediate release tablet Commonly known as:  Oxy IR/ROXICODONE Take 1 tablet (5 mg total) by mouth every 4 (four) hours as needed (pain scale 4-7).   PRENATAL VITAMIN PO Take 1 tablet by mouth every morning.       Diet: routine diet  Activity: Advance as tolerated. Pelvic rest for 6 weeks.   Outpatient follow up:4 weeks Follow up Appt:Future Appointments Date Time Provider Department Center  06/01/2016 7:00 AM [facility] ROOM [facility] None     Newborn Data: Live born female  Birth Weight: 6 lb 10.5 oz (3020 g) APGAR: 8, 8  Baby Feeding: Breast Disposition:home with mother   05/31/2016 Alicia NieceMEISINGER,Alicia Hession D, MD

## 2016-06-01 ENCOUNTER — Inpatient Hospital Stay (HOSPITAL_COMMUNITY): Admission: RE | Admit: 2016-06-01 | Payer: BLUE CROSS/BLUE SHIELD | Source: Ambulatory Visit

## 2016-06-03 ENCOUNTER — Telehealth (HOSPITAL_COMMUNITY): Payer: Self-pay | Admitting: Lactation Services

## 2016-06-03 NOTE — Telephone Encounter (Signed)
Mom called, left message and I called her back. Mom concerned that baby is awake a lot through the night and wants to make sure she is not doing something wrong. Baby is 215 days old and [facility]. Nursing q 2-3 hours. Plenty voids and stools. Reports he sleeps well through the day but was awake last night from 12:30-4 am. Encouragement given. Encouraged to nap during the day and to wake him if he is sleeping more than 3 hours during the day. Reviewed BFSG as resource for support and encouragement. No further questions at present To call prn.

## 2016-06-04 ENCOUNTER — Telehealth (HOSPITAL_COMMUNITY): Payer: Self-pay | Admitting: Lactation Services

## 2016-06-04 NOTE — Telephone Encounter (Signed)
Alicia Jones called asking about giving some formula to her baby so FOB can feed baby.  She tried using her Spectra pump, one breast only after a feeding, and only got 1/2 oz.  This made her worried that baby wasn't getting enough.  She reports a gain of 2 oz 2 days after discharge at Pediatrician's office, at least 5 stools/24 hrs (yellow, seedy) and >5 voids/24 hrs.  Breasts soften after breast feeding, and denies any pain during feeding.  Recommended she exclusively breast feed for 3-6 weeks before introducing a bottle, unless it is medically necessary.  Talked about the other caring tasks FOB can do with baby other than feeding him (bathing, skin to skin, changing diaper etc).  Discussed with Alicia Jones the benefits of attending a Breast Feeding Support Group, and days and times given.  Mom states she may come to one of them.  Also, if she remains concerned about baby's intake, she should call Pediatrician and ask for a weight check sooner than a month of age.  [employer] coming by in 5 days.

## 2016-07-06 DIAGNOSIS — N898 Other specified noninflammatory disorders of vagina: Secondary | ICD-10-CM | POA: Diagnosis not present

## 2016-07-06 DIAGNOSIS — Z1389 Encounter for screening for other disorder: Secondary | ICD-10-CM | POA: Diagnosis not present

## 2016-07-06 DIAGNOSIS — Z3009 Encounter for other general counseling and advice on contraception: Secondary | ICD-10-CM | POA: Diagnosis not present

## 2016-07-19 ENCOUNTER — Ambulatory Visit (INDEPENDENT_AMBULATORY_CARE_PROVIDER_SITE_OTHER): Payer: BLUE CROSS/BLUE SHIELD | Admitting: Nurse Practitioner

## 2016-07-19 ENCOUNTER — Encounter: Payer: Self-pay | Admitting: Nurse Practitioner

## 2016-07-19 ENCOUNTER — Other Ambulatory Visit: Payer: Self-pay | Admitting: *Deleted

## 2016-07-19 VITALS — BP 120/82 | HR 88 | Temp 97.6°F | Ht 64.0 in | Wt 158.0 lb

## 2016-07-19 DIAGNOSIS — F53 Postpartum depression: Secondary | ICD-10-CM

## 2016-07-19 DIAGNOSIS — M26622 Arthralgia of left temporomandibular joint: Secondary | ICD-10-CM

## 2016-07-19 DIAGNOSIS — O99345 Other mental disorders complicating the puerperium: Secondary | ICD-10-CM

## 2016-07-19 MED ORDER — IBUPROFEN 600 MG PO TABS: 600.0000 mg | ORAL_TABLET | Freq: Four times a day (QID) | ORAL | 0 refills | Status: DC | PRN

## 2016-07-19 MED ORDER — PAROXETINE HCL 30 MG PO TABS: 30.0000 mg | ORAL_TABLET | Freq: Every day | ORAL | 2 refills | Status: DC

## 2016-07-19 MED ORDER — CYCLOBENZAPRINE HCL 5 MG PO TABS: 5.0000 mg | ORAL_TABLET | Freq: Every evening | ORAL | 0 refills | Status: DC | PRN

## 2016-07-19 NOTE — Progress Notes (Signed)
Pre visit review using our clinic review tool, if applicable. No additional management support is needed unless otherwise documented below in the visit note. 

## 2016-07-19 NOTE — Patient Instructions (Signed)
We discussed common side effects such as nausea, drowsiness and weight gain.  Also discussed rare but serious side effect of suicide ideation.  She is instructed to discontinue medication go directly to ED if this occurs.  Pt verbalizes understanding.   Plan follow up in 2weeks to evaluate progress.    She was counseled about possible side effects of her antidepressant on an infant while breast-feeding.  Temporomandibular Joint Syndrome Temporomandibular joint (TMJ) syndrome is a condition that affects the joints between your jaw and your skull. The TMJs are located near your ears and allow your jaw to open and close. These joints and the nearby muscles are involved in all movements of the jaw. People with TMJ syndrome have pain in the area of these joints and muscles. Chewing, biting, or other movements of the jaw can be difficult or painful. TMJ syndrome can be caused by various things. In many cases, the condition is mild and goes away within a few weeks. For some people, the condition can become a long-term problem. CAUSES Possible causes of TMJ syndrome include:  Grinding your teeth or clenching your jaw. Some people do this when they are under stress.  Arthritis.  Injury to the jaw.  Head or neck injury.  Teeth or dentures that are not aligned well. In some cases, the cause of TMJ syndrome may not be known. SIGNS AND SYMPTOMS The most common symptom is an aching pain on the side of the head in the area of the TMJ. Other symptoms may include:  Pain when moving your jaw, such as when chewing or biting.  Being unable to open your jaw all the way.  Making a clicking sound when you open your mouth.  Headache.  Earache.  Neck or shoulder pain. DIAGNOSIS Diagnosis can usually be made based on your symptoms, your medical history, and a physical exam. Your health care provider may check the range of motion of your jaw. Imaging tests, such as X-rays or an MRI, are sometimes done. You  may need to see your dentist to determine if your teeth and jaw are lined up correctly. TREATMENT TMJ syndrome often goes away on its own. If treatment is needed, the options may include:  Eating soft foods and applying ice or heat.  Medicines to relieve pain or inflammation.  Medicines to relax the muscles.  A splint, bite plate, or mouthpiece to prevent teeth grinding or jaw clenching.  Relaxation techniques or counseling to help reduce stress.  Transcutaneous electrical nerve stimulation (TENS). This helps to relieve pain by applying an electrical current through the skin.  Acupuncture. This is sometimes helpful to relieve pain.  Jaw surgery. This is rarely needed. HOME CARE INSTRUCTIONS  Take medicines only as directed by your health care provider.  Eat a soft diet if you are having trouble chewing.  Apply ice to the painful area.  Put ice in a plastic bag.  Place a towel between your skin and the bag.  Leave the ice on for 20 minutes, 2-3 times a day.  Apply a warm compress to the painful area as directed.  Massage your jaw area and perform any jaw stretching exercises as recommended by your health care provider.  If you were given a mouthpiece or bite plate, wear it as directed.  Avoid foods that require a lot of chewing. Do not chew gum.  Keep all follow-up visits as directed by your health care provider. This is important. SEEK MEDICAL CARE IF:  You are having  trouble eating.  You have new or worsening symptoms. SEEK IMMEDIATE MEDICAL CARE IF:  Your jaw locks open or closed.   This information is not intended to replace advice given to you by your health care provider. Make sure you discuss any questions you have with your health care provider.   Document Released: 06/29/2001 Document Revised: 10/25/2014 Document Reviewed: 05/09/2014 Elsevier Interactive Patient Education [employer]2016 Elsevier Inc.

## 2016-07-19 NOTE — Progress Notes (Signed)
Subjective:  Patient ID: Alicia Jones, female    DOB: 02/22/1986  Age: 30 y.o. MRN: [id number]  CC: Jaw Pain (Pt stated jaw on the left side is been painful for 3 months.) and Anxiety (worse since birth of child (son) 7weeks ago)   Anxiety  Presents for initial visit. Onset was 1 to 6 months ago. The problem has been gradually worsening. Symptoms include depressed mood, excessive worry, hyperventilation, insomnia, muscle tension and nervous/anxious behavior. Patient reports no chest pain, irritability or suicidal ideas. The severity of symptoms is moderate. Exacerbated by: postpartum. The quality of sleep is fair. Nighttime awakenings: one to two.   Risk factors: recent child birth. Her past medical history is significant for anxiety/panic attacks and depression. There is no history of bipolar disorder or suicide attempts. Past treatments include SSRIs. The treatment provided mild relief. Compliance with prior treatments has been good.  She was started on Prozac 1 month before childbirth. Currently taking made the medication. She is currently breast-feeding.  JAW pain: Also complains of left jaw pain, onset 3 months ago, pain is intermittent, worse with jaw movement(chewing, opening mouth widely). Denies any upper respiratory symptoms, no neck pain or stiffness, no swollen glands, no fever, no change in hearing. Denies any recent dental exam or cleaning or procedures.  Outpatient Medications Prior to Visit  Medication Sig Dispense Refill  . Prenatal Vit-Fe Fumarate-FA (PRENATAL VITAMIN PO) Take 1 tablet by mouth every morning.     Marland Kitchen. albuterol (PROVENTIL HFA;VENTOLIN HFA) 108 (90 Base) MCG/ACT inhaler Inhale 2 puffs into the lungs every 6 (six) hours as needed for wheezing or shortness of breath. (Patient not taking: Reported on 07/19/2016) 1 Inhaler 0  . acetaminophen (TYLENOL) 500 MG tablet Take 500 mg by mouth every 6 (six) hours as needed for mild pain, moderate pain or headache.     Marland Kitchen.  FLUoxetine (PROZAC) 20 MG tablet Take 20 mg by mouth daily.    Marland Kitchen. ibuprofen (ADVIL,MOTRIN) 600 MG tablet Take 1 tablet (600 mg total) by mouth every 6 (six) hours. (Patient not taking: Reported on 07/19/2016) 30 tablet 0  . metFORMIN (GLUCOPHAGE) 500 MG tablet   12  . oxyCODONE (OXY IR/ROXICODONE) 5 MG immediate release tablet Take 1 tablet (5 mg total) by mouth every 4 (four) hours as needed (pain scale 4-7). (Patient not taking: Reported on 07/19/2016) 15 tablet 0   No facility-administered medications prior to visit.     ROS See HPI  Objective:  BP 120/82 (BP Location: Left Arm, Patient Position: Sitting, Cuff Size: Normal)   Pulse 88   Temp 97.6 F (36.4 C)   Ht 5\' 4"  (1.626 m)   Wt 158 lb (71.7 kg)   SpO2 97%   BMI 27.12 kg/m   BP Readings from Last 3 Encounters:  07/19/16 120/82  05/31/16 111/71  11/05/15 108/62    Wt Readings from Last 3 Encounters:  07/19/16 158 lb (71.7 kg)  05/28/16 175 lb (79.4 kg)  11/05/15 146 lb 8 oz (66.5 kg)    Physical Exam  Constitutional: She is oriented to person, place, and time. No distress.  HENT:  Right Ear: Tympanic membrane, external ear and ear canal normal.  Left Ear: Tympanic membrane and external ear normal.  Nose: Nose normal. Right sinus exhibits no maxillary sinus tenderness and no frontal sinus tenderness. Left sinus exhibits no maxillary sinus tenderness and no frontal sinus tenderness.  Mouth/Throat: Uvula is midline, oropharynx is clear and moist and mucous membranes are  normal. No oral lesions. No trismus in the jaw. No oropharyngeal exudate.  No parotid gland tenderness or swelling or mass palpated. Skin appears normal. Tenderness over left TMJ. No scalp tenderness.   Eyes: Conjunctivae and EOM are normal. Pupils are equal, round, and reactive to light. No scleral icterus.  Neck: Normal range of motion. Neck supple. No tracheal deviation present. No thyromegaly present.  Cardiovascular: Normal rate, regular rhythm  and normal heart sounds.   Pulmonary/Chest: Effort normal and breath sounds normal. No respiratory distress.  Abdominal: Soft. She exhibits no distension.  Musculoskeletal: Normal range of motion. She exhibits no edema.  Lymphadenopathy:    She has no cervical adenopathy.  Neurological: She is alert and oriented to person, place, and time.  Skin: Skin is warm and dry.  Psychiatric: She has a normal mood and affect. Her behavior is normal.    Lab Results  Component Value Date   WBC 9.7 05/30/2016   HGB 9.6 (L) 05/30/2016   HCT 26.7 (L) 05/30/2016   PLT 196 05/30/2016   GLUCOSE 65 (L) 11/05/2015   ALT 16 04/02/2014   AST 19 04/02/2014   NA 133 (L) 11/05/2015   K 4.2 11/05/2015   CL 99 11/05/2015   CREATININE 0.56 11/05/2015   BUN 6 11/05/2015   CO2 26 11/05/2015   TSH 2.09 05/20/2011   INR 0.9 04/02/2014    No results found.  Assessment & Plan:   Alicia Jones was seen today for jaw pain and anxiety.  Diagnoses and all orders for this visit:  Depression, postpartum -     PARoxetine (PAXIL) 30 MG tablet; Take 1 tablet (30 mg total) by mouth daily. -     PSYCHOTHERAPY PATIENT &/ FAMILY 45 MINUTES; Future  Arthralgia of left temporomandibular joint -     cyclobenzaprine (FLEXERIL) 5 MG tablet; Take 1 tablet (5 mg total) by mouth at bedtime as needed for muscle spasms. -     ibuprofen (ADVIL,MOTRIN) 600 MG tablet; Take 1 tablet (600 mg total) by mouth every 6 (six) hours as needed.   I have discontinued [name] acetaminophen, FLUoxetine, oxyCODONE, and metFORMIN. I have also changed her ibuprofen. Additionally, I am having her start on PARoxetine and cyclobenzaprine. Lastly, I am having her maintain her Prenatal Vit-Fe Fumarate-FA (PRENATAL VITAMIN PO) and albuterol.  Meds ordered this encounter  Medications  . PARoxetine (PAXIL) 30 MG tablet    Sig: Take 1 tablet (30 mg total) by mouth daily.    Dispense:  30 tablet    Refill:  2    Order Specific Question:    Supervising Provider    Answer:   Tresa Garter [[id number]]  . cyclobenzaprine (FLEXERIL) 5 MG tablet    Sig: Take 1 tablet (5 mg total) by mouth at bedtime as needed for muscle spasms.    Dispense:  20 tablet    Refill:  0    Order Specific Question:   Supervising Provider    Answer:   Tresa Garter [[id number]]  . ibuprofen (ADVIL,MOTRIN) 600 MG tablet    Sig: Take 1 tablet (600 mg total) by mouth every 6 (six) hours as needed.    Dispense:  30 tablet    Refill:  0    Order Specific Question:   Supervising Provider    Answer:   Tresa Garter [[id number]]    Follow-up: Return in about 2 weeks (around 08/02/2016) for depression/anxiety.  Alysia Penna, NP

## 2016-08-05 ENCOUNTER — Ambulatory Visit: Payer: BLUE CROSS/BLUE SHIELD | Admitting: Nurse Practitioner

## 2016-08-06 DIAGNOSIS — L700 Acne vulgaris: Secondary | ICD-10-CM | POA: Diagnosis not present

## 2016-08-06 DIAGNOSIS — D485 Neoplasm of uncertain behavior of skin: Secondary | ICD-10-CM | POA: Diagnosis not present

## 2016-08-06 DIAGNOSIS — L858 Other specified epidermal thickening: Secondary | ICD-10-CM | POA: Diagnosis not present

## 2016-08-08 DIAGNOSIS — R42 Dizziness and giddiness: Secondary | ICD-10-CM | POA: Diagnosis not present

## 2016-08-12 DIAGNOSIS — L03019 Cellulitis of unspecified finger: Secondary | ICD-10-CM | POA: Diagnosis not present

## 2016-12-02 DIAGNOSIS — Z130 Encounter for screening for diseases of the blood and blood-forming organs and certain disorders involving the immune mechanism: Secondary | ICD-10-CM | POA: Diagnosis not present

## 2016-12-02 DIAGNOSIS — L293 Anogenital pruritus, unspecified: Secondary | ICD-10-CM | POA: Diagnosis not present

## 2016-12-02 DIAGNOSIS — Z6828 Body mass index (BMI) 28.0-28.9, adult: Secondary | ICD-10-CM | POA: Diagnosis not present

## 2016-12-02 DIAGNOSIS — Z124 Encounter for screening for malignant neoplasm of cervix: Secondary | ICD-10-CM | POA: Diagnosis not present

## 2016-12-02 DIAGNOSIS — Z1389 Encounter for screening for other disorder: Secondary | ICD-10-CM | POA: Diagnosis not present

## 2016-12-02 DIAGNOSIS — N926 Irregular menstruation, unspecified: Secondary | ICD-10-CM | POA: Diagnosis not present

## 2016-12-02 DIAGNOSIS — Z1151 Encounter for screening for human papillomavirus (HPV): Secondary | ICD-10-CM | POA: Diagnosis not present

## 2016-12-02 DIAGNOSIS — N912 Amenorrhea, unspecified: Secondary | ICD-10-CM | POA: Diagnosis not present

## 2016-12-02 DIAGNOSIS — Z01419 Encounter for gynecological examination (general) (routine) without abnormal findings: Secondary | ICD-10-CM | POA: Diagnosis not present

## 2016-12-03 DIAGNOSIS — Z23 Encounter for immunization: Secondary | ICD-10-CM | POA: Diagnosis not present

## 2017-03-10 DIAGNOSIS — N912 Amenorrhea, unspecified: Secondary | ICD-10-CM | POA: Diagnosis not present

## 2017-03-23 ENCOUNTER — Encounter: Payer: Self-pay | Admitting: Internal Medicine

## 2017-03-23 ENCOUNTER — Ambulatory Visit (INDEPENDENT_AMBULATORY_CARE_PROVIDER_SITE_OTHER): Payer: BLUE CROSS/BLUE SHIELD | Admitting: Internal Medicine

## 2017-03-23 VITALS — BP 104/70 | HR 77 | Ht 63.0 in | Wt 153.0 lb

## 2017-03-23 DIAGNOSIS — E669 Obesity, unspecified: Secondary | ICD-10-CM | POA: Insufficient documentation

## 2017-03-23 DIAGNOSIS — K921 Melena: Secondary | ICD-10-CM | POA: Diagnosis not present

## 2017-03-23 DIAGNOSIS — R197 Diarrhea, unspecified: Secondary | ICD-10-CM | POA: Diagnosis not present

## 2017-03-23 DIAGNOSIS — E6609 Other obesity due to excess calories: Secondary | ICD-10-CM | POA: Diagnosis not present

## 2017-03-23 MED ORDER — LIRAGLUTIDE 18 MG/3ML ~~LOC~~ SOPN: PEN_INJECTOR | SUBCUTANEOUS | 0 refills | Status: DC

## 2017-03-23 NOTE — Patient Instructions (Signed)
OK to stop the metformin  Please take all new medication as prescribed - the victoza  - to start at  0.6 mg SQ dialy for one wk, then 1.2 mg daily after taht  Please continue all other medications as before, and refills have been done if requested.  Please have the pharmacy call with any other refills you may need.  Please keep your appointments with your specialists as you may have planned

## 2017-03-23 NOTE — Progress Notes (Signed)
Subjective:    Patient ID: Alicia Jones, female    DOB: 07/04/1986, 31 y.o.   MRN: [id number]  HPI  Here to f/u with c/o recurring daily loose stools and diarrhea with nausea; was in her usual state of health, has been struggling with obesity and has been dx with PCOS, now being tx with relatively higher doses metformin now up to 1500 mg per day, overall length of tx now about 1.5 yrs, and still struggling with wt, just not losing wt as she desires.  Is s/p CCX. Daily nausea and bowel changes are assoc with occasional crampy pains which are better after BM.  No fever or blood, except once recently trace to wiping only after several BM's that day.  No actively trying for second pregnancy, but may consider in the future.  Pt denies chest pain, increased sob or doe, wheezing, orthopnea, PND, increased LE swelling, palpitations, dizziness or syncope.   Pt denies polydipsia, polyuria Past Medical History:  Diagnosis Date  . Abdominal pain   . Abdominal pain, periumbilical   . Acute tonsillitis 09/13/2008  . ALLERGIC RHINITIS 02/07/2008  . Chest pain   . Chest pain   . Constipation   . Diarrhea   . Easy bruising   . Generalized headaches   . MIGRAINE HEADACHE 02/03/2010  . Nausea and vomiting   . SPRAIN&STRAIN OTH SPEC SITES SHOULDER&UPPER ARM 02/03/2010  . SVD (spontaneous vaginal delivery) 05/29/2016  . Weakness   . Weight loss, unintentional    Past Surgical History:  Procedure Laterality Date  . CHOLECYSTECTOMY  07/2011  . LAPAROSCOPIC CHOLECYSTECTOMY W/ CHOLANGIOGRAPHY  07/12/11    reports that she has never smoked. She has never used smokeless tobacco. She reports that she drinks alcohol. She reports that she does not use drugs. family history includes Breast cancer in her maternal grandmother; Cancer in her maternal grandmother, paternal grandfather, and paternal grandmother; Diabetes in her father; Gallbladder disease in her father; Liver disease in her father. No Known Allergies No  current outpatient prescriptions on file prior to visit.   No current facility-administered medications on file prior to visit.    Review of Systems  Constitutional: Negative for other unusual diaphoresis or sweats HENT: Negative for ear discharge or swelling Eyes: Negative for other worsening visual disturbances Respiratory: Negative for stridor or other swelling  Gastrointestinal: Negative for worsening distension or other blood Genitourinary: Negative for retention or other urinary change Musculoskeletal: Negative for other MSK pain or swelling Skin: Negative for color change or other new lesions Neurological: Negative for worsening tremors and other numbness  Psychiatric/Behavioral: Negative for worsening agitation or other fatigue All other system neg per pt    Objective:   Physical Exam BP 104/70   Pulse 77   Ht 5\' 3"  (1.6 m)   Wt 153 lb (69.4 kg)   SpO2 99%   BMI 27.10 kg/m  VS noted, not ill appearing Constitutional: Pt appears in NAD HENT: Head: NCAT.  Right Ear: External ear normal.  Left Ear: External ear normal.  Eyes: . Pupils are equal, round, and reactive to light. Conjunctivae and EOM are normal Nose: without d/c or deformity Neck: Neck supple. Gross normal ROM Cardiovascular: Normal rate and regular rhythm.   Pulmonary/Chest: Effort normal and breath sounds without rales or wheezing.  Abd:  Soft, NT, ND, + BS, no organomegaly - benign exam Neurological: Pt is alert. At baseline orientation, motor grossly intact Skin: Skin is warm. No rashes, other new lesions, no  LE edema Psychiatric: Pt behavior is normal without agitation  No other exam findings  Lab Results  Component Value Date   WBC 9.7 05/30/2016   HGB 9.6 (L) 05/30/2016   HCT 26.7 (L) 05/30/2016   PLT 196 05/30/2016   GLUCOSE 65 (L) 11/05/2015   ALT 16 04/02/2014   AST 19 04/02/2014   NA 133 (L) 11/05/2015   K 4.2 11/05/2015   CL 99 11/05/2015   CREATININE 0.56 11/05/2015   BUN 6  11/05/2015   CO2 26 11/05/2015   TSH 2.09 05/20/2011   INR 0.9 04/02/2014        Assessment & Plan:

## 2017-03-26 DIAGNOSIS — K921 Melena: Secondary | ICD-10-CM | POA: Insufficient documentation

## 2017-03-26 NOTE — Assessment & Plan Note (Signed)
With high suspicion for metformin related GI distress; options include reducing the dose or d./c med; pt opts to d/c at this time

## 2017-03-26 NOTE — Assessment & Plan Note (Addendum)
Still struggling, and though not well studied for PCOS will try Victoza daily with one wk lower dose to avoid GI upset then full dose after, as this has been approved for wt loss by FDA without a dx of DM;  to f/u any worsening symptoms or concerns

## 2017-03-26 NOTE — Assessment & Plan Note (Signed)
Trace only one episode, declines further evaluation, did have signficant anemia s/p parturition, declines f/u lab today

## 2017-05-19 ENCOUNTER — Telehealth: Payer: Self-pay | Admitting: Internal Medicine

## 2017-05-19 MED ORDER — LIRAGLUTIDE 18 MG/3ML ~~LOC~~ SOPN: PEN_INJECTOR | SUBCUTANEOUS | 5 refills | Status: DC

## 2017-05-19 NOTE — Telephone Encounter (Signed)
Victoza  - to start at  0.6 mg SQ dialy for one wk, then 1.2 mg daily after taht  Patient has been taking this medication of the samples she was giving. She is out. She wants to know if she can have a refill on it and would also like to know if the dose could be upped some more. Please advise. Thank you.

## 2017-05-19 NOTE — Telephone Encounter (Signed)
Done erx  -trulicity increased to 1.8 ml per dose

## 2017-05-19 NOTE — Telephone Encounter (Signed)
I dont know how to respond to this since I am not Dr Yetta BarreJones and pt is not being specific about which cough med to which she refers

## 2017-05-19 NOTE — Telephone Encounter (Signed)
Pt called in and has some questions about this cough meds that was called in?  She thinks she told Dr Yetta Barrejones the wrong one.  She would like nurse to call her when you get a chance   Cell number is best

## 2017-05-20 MED ORDER — LIRAGLUTIDE 18 MG/3ML ~~LOC~~ SOPN: PEN_INJECTOR | SUBCUTANEOUS | 5 refills | Status: DC

## 2017-05-20 NOTE — Telephone Encounter (Signed)
Done

## 2017-05-20 NOTE — Telephone Encounter (Signed)
Patient has been informed. But she asked that the RX be sent to this pharmacy. She was informed to call the pharmacy and make sure it is there before she goes to picks it up.   [facility] on  [address] [place], [place] [address] [phone]

## 2017-06-21 ENCOUNTER — Ambulatory Visit: Payer: BLUE CROSS/BLUE SHIELD | Admitting: Family Medicine

## 2017-06-22 ENCOUNTER — Encounter: Payer: Self-pay | Admitting: Internal Medicine

## 2017-06-22 ENCOUNTER — Ambulatory Visit (INDEPENDENT_AMBULATORY_CARE_PROVIDER_SITE_OTHER): Payer: BLUE CROSS/BLUE SHIELD | Admitting: Internal Medicine

## 2017-06-22 ENCOUNTER — Ambulatory Visit (INDEPENDENT_AMBULATORY_CARE_PROVIDER_SITE_OTHER)
Admission: RE | Admit: 2017-06-22 | Discharge: 2017-06-22 | Disposition: A | Discharge status: Home / Self Care | Payer: BLUE CROSS/BLUE SHIELD | Source: Ambulatory Visit | Attending: Internal Medicine | Admitting: Internal Medicine

## 2017-06-22 VITALS — BP 106/72 | HR 85 | Temp 97.9°F | Ht 63.0 in | Wt 147.0 lb

## 2017-06-22 DIAGNOSIS — R05 Cough: Secondary | ICD-10-CM | POA: Diagnosis not present

## 2017-06-22 DIAGNOSIS — B373 Candidiasis of vulva and vagina: Secondary | ICD-10-CM | POA: Diagnosis not present

## 2017-06-22 DIAGNOSIS — R0600 Dyspnea, unspecified: Secondary | ICD-10-CM

## 2017-06-22 DIAGNOSIS — R079 Chest pain, unspecified: Secondary | ICD-10-CM

## 2017-06-22 DIAGNOSIS — B3731 Acute candidiasis of vulva and vagina: Secondary | ICD-10-CM | POA: Insufficient documentation

## 2017-06-22 DIAGNOSIS — R059 Cough, unspecified: Secondary | ICD-10-CM

## 2017-06-22 DIAGNOSIS — R0602 Shortness of breath: Secondary | ICD-10-CM | POA: Diagnosis not present

## 2017-06-22 MED ORDER — FLUCONAZOLE 150 MG PO TABS: ORAL_TABLET | ORAL | 1 refills | Status: DC

## 2017-06-22 MED ORDER — AZITHROMYCIN 250 MG PO TABS: ORAL_TABLET | ORAL | 1 refills | Status: DC

## 2017-06-22 MED ORDER — HYDROCODONE-HOMATROPINE 5-1.5 MG/5ML PO SYRP: 5.0000 mL | ORAL_SOLUTION | Freq: Four times a day (QID) | ORAL | 0 refills | Status: AC | PRN

## 2017-06-22 NOTE — Patient Instructions (Signed)
Please take all new medication as prescribed - the antibiotic, cough medicine, and diflucan for yeast  Please continue all other medications as before, including the victoza if you think is still at least some effective  Please have the pharmacy call with any other refills you may need.  Please keep your appointments with your specialists as you may have planned  Please go to the XRAY Department in the Basement (go straight as you get off the elevator) for the x-ray testing  You will be contacted by phone if any changes need to be made immediately.  Otherwise, you will receive a letter about your results with an explanation, but please check with MyChart first.  Please remember to sign up for MyChart if you have not done so, as this will be important to you in the future with finding out test results, communicating by private email, and scheduling acute appointments online when needed.

## 2017-06-22 NOTE — Assessment & Plan Note (Signed)
Mild to mod, for diflucan course,  to f/u any worsening symptoms or concerns 

## 2017-06-22 NOTE — Assessment & Plan Note (Addendum)
Exam benign, do not suspect cardiac, for cxr, ok to follow with tylenol prn

## 2017-06-22 NOTE — Progress Notes (Signed)
Subjective:    Patient ID: Alicia Jones, female    DOB: 06/27/1986, 31 y.o.   MRN: [id number]  HPI Here with acute onset mild to mod 2-3 days ST, HA, general weakness and malaise, with prod cough greenish sputum with diffuse achy cp with couging, but Pt denies, increased sob or doe, wheezing, orthopnea, PND, increased LE swelling, palpitations, dizziness or syncope.  Does also incidentally have vaginal yeast symptoms of itchy somewhat thick d/c for about 1 wk. Not pregnant per testing last wk per pt Wt Readings from Last 3 Encounters:  06/22/17 147 lb (66.7 kg)  03/23/17 153 lb (69.4 kg)  07/19/16 158 lb (71.7 kg)   Past Medical History:  Diagnosis Date  . Abdominal pain   . Abdominal pain, periumbilical   . Acute tonsillitis 09/13/2008  . ALLERGIC RHINITIS 02/07/2008  . Chest pain   . Chest pain   . Constipation   . Diarrhea   . Easy bruising   . Generalized headaches   . MIGRAINE HEADACHE 02/03/2010  . Nausea and vomiting   . SPRAIN&STRAIN OTH SPEC SITES SHOULDER&UPPER ARM 02/03/2010  . SVD (spontaneous vaginal delivery) 05/29/2016  . Weakness   . Weight loss, unintentional    Past Surgical History:  Procedure Laterality Date  . CHOLECYSTECTOMY  07/2011  . LAPAROSCOPIC CHOLECYSTECTOMY W/ CHOLANGIOGRAPHY  07/12/11    reports that she has never smoked. She has never used smokeless tobacco. She reports that she drinks alcohol. She reports that she does not use drugs. family history includes Breast cancer in her maternal grandmother; Cancer in her maternal grandmother, paternal grandfather, and paternal grandmother; Diabetes in her father; Gallbladder disease in her father; Liver disease in her father. No Known Allergies Current Outpatient Prescriptions on File Prior to Visit  Medication Sig Dispense Refill  . liraglutide 18 MG/3ML SOPN 1.8 mg sq daily 15 mL 5   No current facility-administered medications on file prior to visit.    Review of Systems  Constitutional: Negative  for other unusual diaphoresis or sweats HENT: Negative for ear discharge or swelling Eyes: Negative for other worsening visual disturbances Respiratory: Negative for stridor or other swelling  Gastrointestinal: Negative for worsening distension or other blood Genitourinary: Negative for retention or other urinary change Musculoskeletal: Negative for other MSK pain or swelling Skin: Negative for color change or other new lesions Neurological: Negative for worsening tremors and other numbness  Psychiatric/Behavioral: Negative for worsening agitation or other fatigue All other system neg per pt    Objective:   Physical Exam BP 106/72   Pulse 85   Temp 97.9 F (36.6 C) (Oral)   Ht 5\' 3"  (1.6 m)   Wt 147 lb (66.7 kg)   SpO2 99%   BMI 26.04 kg/m  VS noted, mild ill Constitutional: Pt appears in NAD HENT: Head: NCAT.  Right Ear: External ear normal.  Left Ear: External ear normal.  Eyes: . Pupils are equal, round, and reactive to light. Conjunctivae and EOM are normal Bilat tm's with mild erythema.  Max sinus areas non tender.  Pharynx with severe erythema, no exudate Nose: without d/c or deformity Neck: Neck supple. Gross normal ROM with tender bilat submandib LA Cardiovascular: Normal rate and regular rhythm.   Pulmonary/Chest: Effort normal and breath sounds decreased without rales or wheezing.  Neurological: Pt is alert. At baseline orientation, motor grossly intact Skin: Skin is warm. No rashes, other new lesions, no LE edema Psychiatric: Pt behavior is normal without agitation  No other  exam findings     Assessment & Plan:

## 2017-06-22 NOTE — Assessment & Plan Note (Addendum)
Mild to mod, c/w bronchitis vs pna, for cxr, for antibx course, cough med  Prn, mucinex otc bid prn,  to f/u any worsening symptoms or concerns

## 2017-07-05 ENCOUNTER — Telehealth: Payer: Self-pay | Admitting: Internal Medicine

## 2017-07-05 MED ORDER — LIRAGLUTIDE 18 MG/3ML ~~LOC~~ SOPN: PEN_INJECTOR | SUBCUTANEOUS | 4 refills | Status: DC

## 2017-07-05 MED ORDER — INSULIN PEN NEEDLE 31G X 8 MM MISC: 4 refills | Status: DC

## 2017-07-05 NOTE — Telephone Encounter (Signed)
Pt called requesting a refill on Victoza and also a prescription sent for the needles to [facility] on [address]

## 2017-07-05 NOTE — Telephone Encounter (Signed)
Resent rx from 05/20/17 that was given by MD for the Victoza. Sent rx for pen needles as well to pof...Raechel Chute

## 2017-07-18 DIAGNOSIS — E282 Polycystic ovarian syndrome: Secondary | ICD-10-CM | POA: Diagnosis not present

## 2017-07-18 DIAGNOSIS — N911 Secondary amenorrhea: Secondary | ICD-10-CM | POA: Diagnosis not present

## 2017-10-17 DIAGNOSIS — H10023 Other mucopurulent conjunctivitis, bilateral: Secondary | ICD-10-CM | POA: Diagnosis not present

## 2017-10-17 DIAGNOSIS — J029 Acute pharyngitis, unspecified: Secondary | ICD-10-CM | POA: Diagnosis not present

## 2017-11-11 DIAGNOSIS — M6530 Trigger finger, unspecified finger: Secondary | ICD-10-CM | POA: Diagnosis not present

## 2017-11-11 DIAGNOSIS — L03019 Cellulitis of unspecified finger: Secondary | ICD-10-CM | POA: Diagnosis not present

## 2017-12-04 DIAGNOSIS — J029 Acute pharyngitis, unspecified: Secondary | ICD-10-CM | POA: Diagnosis not present

## 2017-12-04 DIAGNOSIS — J00 Acute nasopharyngitis [common cold]: Secondary | ICD-10-CM | POA: Diagnosis not present

## 2017-12-04 DIAGNOSIS — H10022 Other mucopurulent conjunctivitis, left eye: Secondary | ICD-10-CM | POA: Diagnosis not present

## 2018-01-27 DIAGNOSIS — E282 Polycystic ovarian syndrome: Secondary | ICD-10-CM | POA: Diagnosis not present

## 2018-01-27 DIAGNOSIS — Z3169 Encounter for other general counseling and advice on procreation: Secondary | ICD-10-CM | POA: Diagnosis not present

## 2018-02-21 DIAGNOSIS — Z130 Encounter for screening for diseases of the blood and blood-forming organs and certain disorders involving the immune mechanism: Secondary | ICD-10-CM | POA: Diagnosis not present

## 2018-02-21 DIAGNOSIS — Z1389 Encounter for screening for other disorder: Secondary | ICD-10-CM | POA: Diagnosis not present

## 2018-02-21 DIAGNOSIS — Z01419 Encounter for gynecological examination (general) (routine) without abnormal findings: Secondary | ICD-10-CM | POA: Diagnosis not present

## 2018-02-21 DIAGNOSIS — Z6825 Body mass index (BMI) 25.0-25.9, adult: Secondary | ICD-10-CM | POA: Diagnosis not present

## 2018-03-14 DIAGNOSIS — N911 Secondary amenorrhea: Secondary | ICD-10-CM | POA: Diagnosis not present

## 2018-03-14 DIAGNOSIS — Z3201 Encounter for pregnancy test, result positive: Secondary | ICD-10-CM | POA: Diagnosis not present

## 2018-04-05 DIAGNOSIS — Z363 Encounter for antenatal screening for malformations: Secondary | ICD-10-CM | POA: Diagnosis not present

## 2018-04-05 DIAGNOSIS — Z3A00 Weeks of gestation of pregnancy not specified: Secondary | ICD-10-CM | POA: Diagnosis not present

## 2018-04-05 DIAGNOSIS — Z3A09 9 weeks gestation of pregnancy: Secondary | ICD-10-CM | POA: Diagnosis not present

## 2018-04-05 DIAGNOSIS — Z3A19 19 weeks gestation of pregnancy: Secondary | ICD-10-CM | POA: Diagnosis not present

## 2018-04-05 DIAGNOSIS — Z3480 Encounter for supervision of other normal pregnancy, unspecified trimester: Secondary | ICD-10-CM | POA: Diagnosis not present

## 2018-04-05 DIAGNOSIS — Z3A01 Less than 8 weeks gestation of pregnancy: Secondary | ICD-10-CM | POA: Diagnosis not present

## 2018-04-05 DIAGNOSIS — Z113 Encounter for screening for infections with a predominantly sexual mode of transmission: Secondary | ICD-10-CM | POA: Diagnosis not present

## 2018-04-05 DIAGNOSIS — Z3481 Encounter for supervision of other normal pregnancy, first trimester: Secondary | ICD-10-CM | POA: Diagnosis not present

## 2018-04-05 DIAGNOSIS — O26891 Other specified pregnancy related conditions, first trimester: Secondary | ICD-10-CM | POA: Diagnosis not present

## 2018-04-05 DIAGNOSIS — Z3689 Encounter for other specified antenatal screening: Secondary | ICD-10-CM | POA: Diagnosis not present

## 2018-04-05 LAB — OB RESULTS CONSOLE ABO/RH: RH Type: POSITIVE

## 2018-04-05 LAB — OB RESULTS CONSOLE RPR: RPR: NONREACTIVE

## 2018-04-05 LAB — OB RESULTS CONSOLE HIV ANTIBODY (ROUTINE TESTING): HIV: NONREACTIVE

## 2018-04-05 LAB — OB RESULTS CONSOLE GC/CHLAMYDIA
CHLAMYDIA, DNA PROBE: NEGATIVE
Gonorrhea: NEGATIVE

## 2018-04-05 LAB — OB RESULTS CONSOLE HEPATITIS B SURFACE ANTIGEN: Hepatitis B Surface Ag: NEGATIVE

## 2018-04-05 LAB — OB RESULTS CONSOLE ANTIBODY SCREEN: ANTIBODY SCREEN: NEGATIVE

## 2018-04-05 LAB — OB RESULTS CONSOLE RUBELLA ANTIBODY, IGM: Rubella: IMMUNE

## 2018-04-14 DIAGNOSIS — O09521 Supervision of elderly multigravida, first trimester: Secondary | ICD-10-CM | POA: Diagnosis not present

## 2018-04-14 DIAGNOSIS — O09511 Supervision of elderly primigravida, first trimester: Secondary | ICD-10-CM | POA: Diagnosis not present

## 2018-06-20 DIAGNOSIS — Z363 Encounter for antenatal screening for malformations: Secondary | ICD-10-CM | POA: Diagnosis not present

## 2018-06-20 DIAGNOSIS — Z3A19 19 weeks gestation of pregnancy: Secondary | ICD-10-CM | POA: Diagnosis not present

## 2018-06-27 ENCOUNTER — Ambulatory Visit: Payer: BLUE CROSS/BLUE SHIELD | Admitting: Internal Medicine

## 2018-06-27 DIAGNOSIS — K120 Recurrent oral aphthae: Secondary | ICD-10-CM | POA: Diagnosis not present

## 2018-06-27 DIAGNOSIS — J029 Acute pharyngitis, unspecified: Secondary | ICD-10-CM | POA: Diagnosis not present

## 2018-07-04 DIAGNOSIS — Z3A21 21 weeks gestation of pregnancy: Secondary | ICD-10-CM | POA: Diagnosis not present

## 2018-07-04 DIAGNOSIS — Z362 Encounter for other antenatal screening follow-up: Secondary | ICD-10-CM | POA: Diagnosis not present

## 2018-07-05 ENCOUNTER — Other Ambulatory Visit (HOSPITAL_COMMUNITY): Payer: Self-pay | Admitting: Obstetrics and Gynecology

## 2018-07-05 DIAGNOSIS — R9389 Abnormal findings on diagnostic imaging of other specified body structures: Secondary | ICD-10-CM

## 2018-07-05 DIAGNOSIS — Z3689 Encounter for other specified antenatal screening: Secondary | ICD-10-CM

## 2018-07-05 DIAGNOSIS — Z3A22 22 weeks gestation of pregnancy: Secondary | ICD-10-CM

## 2018-07-06 ENCOUNTER — Encounter (HOSPITAL_COMMUNITY): Payer: Self-pay

## 2018-07-07 DIAGNOSIS — Z23 Encounter for immunization: Secondary | ICD-10-CM | POA: Diagnosis not present

## 2018-07-12 ENCOUNTER — Encounter (HOSPITAL_COMMUNITY): Payer: Self-pay | Admitting: *Deleted

## 2018-07-13 ENCOUNTER — Ambulatory Visit (HOSPITAL_COMMUNITY)
Admission: RE | Admit: 2018-07-13 | Discharge: 2018-07-13 | Disposition: A | Discharge status: Home / Self Care | Payer: BLUE CROSS/BLUE SHIELD | Source: Ambulatory Visit | Attending: Obstetrics and Gynecology | Admitting: Obstetrics and Gynecology

## 2018-07-13 ENCOUNTER — Encounter (HOSPITAL_COMMUNITY): Payer: Self-pay

## 2018-07-13 DIAGNOSIS — O358XX0 Maternal care for other (suspected) fetal abnormality and damage, not applicable or unspecified: Secondary | ICD-10-CM | POA: Insufficient documentation

## 2018-07-13 DIAGNOSIS — O359XX0 Maternal care for (suspected) fetal abnormality and damage, unspecified, not applicable or unspecified: Secondary | ICD-10-CM | POA: Diagnosis not present

## 2018-07-13 DIAGNOSIS — Z363 Encounter for antenatal screening for malformations: Secondary | ICD-10-CM | POA: Diagnosis not present

## 2018-07-13 DIAGNOSIS — R9389 Abnormal findings on diagnostic imaging of other specified body structures: Secondary | ICD-10-CM

## 2018-07-13 DIAGNOSIS — Z3A22 22 weeks gestation of pregnancy: Secondary | ICD-10-CM | POA: Insufficient documentation

## 2018-07-13 DIAGNOSIS — Z3689 Encounter for other specified antenatal screening: Secondary | ICD-10-CM

## 2018-07-14 ENCOUNTER — Other Ambulatory Visit (HOSPITAL_COMMUNITY): Payer: Self-pay | Admitting: *Deleted

## 2018-07-14 DIAGNOSIS — Z362 Encounter for other antenatal screening follow-up: Secondary | ICD-10-CM

## 2018-08-09 ENCOUNTER — Ambulatory Visit (HOSPITAL_COMMUNITY): Payer: BLUE CROSS/BLUE SHIELD

## 2018-08-09 ENCOUNTER — Encounter (HOSPITAL_COMMUNITY): Payer: Self-pay

## 2018-08-09 ENCOUNTER — Ambulatory Visit (HOSPITAL_COMMUNITY)
Admission: RE | Admit: 2018-08-09 | Discharge: 2018-08-09 | Disposition: A | Discharge status: Home / Self Care | Payer: BLUE CROSS/BLUE SHIELD | Source: Ambulatory Visit | Attending: Obstetrics and Gynecology | Admitting: Obstetrics and Gynecology

## 2018-08-09 DIAGNOSIS — Z3A26 26 weeks gestation of pregnancy: Secondary | ICD-10-CM

## 2018-08-09 DIAGNOSIS — Z362 Encounter for other antenatal screening follow-up: Secondary | ICD-10-CM | POA: Diagnosis not present

## 2018-08-18 DIAGNOSIS — Z3689 Encounter for other specified antenatal screening: Secondary | ICD-10-CM | POA: Diagnosis not present

## 2018-08-25 DIAGNOSIS — N9489 Other specified conditions associated with female genital organs and menstrual cycle: Secondary | ICD-10-CM | POA: Diagnosis not present

## 2018-10-16 DIAGNOSIS — Z3685 Encounter for antenatal screening for Streptococcus B: Secondary | ICD-10-CM | POA: Diagnosis not present

## 2018-10-16 DIAGNOSIS — Z3689 Encounter for other specified antenatal screening: Secondary | ICD-10-CM | POA: Diagnosis not present

## 2018-10-16 DIAGNOSIS — E282 Polycystic ovarian syndrome: Secondary | ICD-10-CM | POA: Diagnosis not present

## 2018-10-16 DIAGNOSIS — Z3483 Encounter for supervision of other normal pregnancy, third trimester: Secondary | ICD-10-CM | POA: Diagnosis not present

## 2018-10-16 DIAGNOSIS — Z3A36 36 weeks gestation of pregnancy: Secondary | ICD-10-CM | POA: Diagnosis not present

## 2018-10-17 LAB — OB RESULTS CONSOLE GBS: STREP GROUP B AG: NEGATIVE

## 2018-10-18 NOTE — L&D Delivery Note (Signed)
Delivery Note Pt progressed to complete and pushed very well.  At 4:40 PM a viable female was delivered via Vaginal, Spontaneous (Presentation: vtx; ROA ).  APGAR: 9, 9; weight pending.   Placenta status: spontaneous, intact.  Cord:  with the following complications: none.  Anesthesia:  Epidural Episiotomy: None Lacerations: 2nd degree Suture Repair: 3.0 vicryl rapide Est. Blood Loss (mL):  200  Mom to postpartum.  Baby to Couplet care / Skin to Skin.  Alicia Jones 11/03/2018, 5:01 PM

## 2018-10-23 DIAGNOSIS — Z3A37 37 weeks gestation of pregnancy: Secondary | ICD-10-CM | POA: Diagnosis not present

## 2018-10-23 DIAGNOSIS — O368139 Decreased fetal movements, third trimester, other fetus: Secondary | ICD-10-CM | POA: Diagnosis not present

## 2018-10-25 ENCOUNTER — Encounter (HOSPITAL_COMMUNITY): Payer: Self-pay | Admitting: *Deleted

## 2018-10-25 ENCOUNTER — Telehealth (HOSPITAL_COMMUNITY): Payer: Self-pay | Admitting: *Deleted

## 2018-10-25 NOTE — Telephone Encounter (Signed)
Preadmission screen  

## 2018-11-02 DIAGNOSIS — Z3A38 38 weeks gestation of pregnancy: Secondary | ICD-10-CM | POA: Diagnosis not present

## 2018-11-02 DIAGNOSIS — O368130 Decreased fetal movements, third trimester, not applicable or unspecified: Secondary | ICD-10-CM | POA: Diagnosis not present

## 2018-11-03 ENCOUNTER — Encounter (HOSPITAL_COMMUNITY): Payer: Self-pay | Admitting: *Deleted

## 2018-11-03 ENCOUNTER — Other Ambulatory Visit: Payer: Self-pay

## 2018-11-03 ENCOUNTER — Inpatient Hospital Stay (HOSPITAL_COMMUNITY): Payer: BLUE CROSS/BLUE SHIELD | Admitting: Anesthesiology

## 2018-11-03 ENCOUNTER — Inpatient Hospital Stay (HOSPITAL_COMMUNITY)
Admission: AD | Admit: 2018-11-03 | Discharge: 2018-11-05 | DRG: 807 | Disposition: A | Discharge status: Home / Self Care | Payer: BLUE CROSS/BLUE SHIELD | Attending: Obstetrics and Gynecology | Admitting: Obstetrics and Gynecology

## 2018-11-03 DIAGNOSIS — Z23 Encounter for immunization: Secondary | ICD-10-CM | POA: Diagnosis not present

## 2018-11-03 DIAGNOSIS — Z3483 Encounter for supervision of other normal pregnancy, third trimester: Secondary | ICD-10-CM | POA: Diagnosis not present

## 2018-11-03 DIAGNOSIS — Z3A39 39 weeks gestation of pregnancy: Secondary | ICD-10-CM | POA: Diagnosis not present

## 2018-11-03 DIAGNOSIS — Z87891 Personal history of nicotine dependence: Secondary | ICD-10-CM

## 2018-11-03 LAB — TYPE AND SCREEN
ABO/RH(D): B POS
Antibody Screen: NEGATIVE

## 2018-11-03 LAB — CBC
HEMATOCRIT: 34.7 % — AB (ref 36.0–46.0)
Hemoglobin: 12.2 g/dL (ref 12.0–15.0)
MCH: 34.3 pg — ABNORMAL HIGH (ref 26.0–34.0)
MCHC: 35.2 g/dL (ref 30.0–36.0)
MCV: 97.5 fL (ref 80.0–100.0)
Platelets: 185 10*3/uL (ref 150–400)
RBC: 3.56 MIL/uL — ABNORMAL LOW (ref 3.87–5.11)
RDW: 13.1 % (ref 11.5–15.5)
WBC: 6.6 10*3/uL (ref 4.0–10.5)
nRBC: 0 % (ref 0.0–0.2)

## 2018-11-03 MED ORDER — LACTATED RINGERS IV SOLN
INTRAVENOUS | Status: DC
  Administered 2018-11-03 (×2): via INTRAVENOUS

## 2018-11-03 MED ORDER — LACTATED RINGERS IV SOLN
500.0000 mL | Freq: Once | INTRAVENOUS | Status: AC
  Administered 2018-11-03: 500 mL via INTRAVENOUS

## 2018-11-03 MED ORDER — MAGNESIUM HYDROXIDE 400 MG/5ML PO SUSP: 30.0000 mL | ORAL | Status: DC | PRN

## 2018-11-03 MED ORDER — OXYTOCIN BOLUS FROM INFUSION
500.0000 mL | Freq: Once | INTRAVENOUS | Status: AC
  Administered 2018-11-03: 500 mL via INTRAVENOUS

## 2018-11-03 MED ORDER — WITCH HAZEL-GLYCERIN EX PADS: 1.0000 "application " | MEDICATED_PAD | CUTANEOUS | Status: DC | PRN

## 2018-11-03 MED ORDER — LACTATED RINGERS IV SOLN: 500.0000 mL | Freq: Once | INTRAVENOUS | Status: DC

## 2018-11-03 MED ORDER — EPHEDRINE 5 MG/ML INJ
10.0000 mg | INTRAVENOUS | Status: DC | PRN
  Filled 2018-11-03: qty 2

## 2018-11-03 MED ORDER — MEASLES, MUMPS & RUBELLA VAC IJ SOLR: 0.5000 mL | Freq: Once | INTRAMUSCULAR | Status: DC

## 2018-11-03 MED ORDER — LIDOCAINE HCL (PF) 1 % IJ SOLN
30.0000 mL | INTRAMUSCULAR | Status: DC | PRN
  Filled 2018-11-03: qty 30

## 2018-11-03 MED ORDER — DIBUCAINE 1 % RE OINT: 1.0000 "application " | TOPICAL_OINTMENT | RECTAL | Status: DC | PRN

## 2018-11-03 MED ORDER — DIPHENHYDRAMINE HCL 50 MG/ML IJ SOLN: 12.5000 mg | INTRAMUSCULAR | Status: DC | PRN

## 2018-11-03 MED ORDER — COCONUT OIL OIL: 1.0000 "application " | TOPICAL_OIL | Status: DC | PRN

## 2018-11-03 MED ORDER — SENNOSIDES-DOCUSATE SODIUM 8.6-50 MG PO TABS
2.0000 | ORAL_TABLET | ORAL | Status: DC
  Administered 2018-11-03 – 2018-11-04 (×2): 2 via ORAL
  Filled 2018-11-03 (×2): qty 2

## 2018-11-03 MED ORDER — LIDOCAINE HCL (PF) 1 % IJ SOLN
INTRAMUSCULAR | Status: DC | PRN
  Administered 2018-11-03 (×2): 4 mL via EPIDURAL

## 2018-11-03 MED ORDER — IBUPROFEN 600 MG PO TABS
600.0000 mg | ORAL_TABLET | Freq: Four times a day (QID) | ORAL | Status: DC
  Administered 2018-11-03 – 2018-11-05 (×6): 600 mg via ORAL
  Filled 2018-11-03 (×6): qty 1

## 2018-11-03 MED ORDER — TETANUS-DIPHTH-ACELL PERTUSSIS 5-2.5-18.5 LF-MCG/0.5 IM SUSP: 0.5000 mL | Freq: Once | INTRAMUSCULAR | Status: DC

## 2018-11-03 MED ORDER — OXYCODONE-ACETAMINOPHEN 5-325 MG PO TABS: 1.0000 | ORAL_TABLET | ORAL | Status: DC | PRN

## 2018-11-03 MED ORDER — ONDANSETRON HCL 4 MG/2ML IJ SOLN
4.0000 mg | Freq: Four times a day (QID) | INTRAMUSCULAR | Status: DC | PRN
  Filled 2018-11-03: qty 2

## 2018-11-03 MED ORDER — PRENATAL MULTIVITAMIN CH
1.0000 | ORAL_TABLET | Freq: Every day | ORAL | Status: DC
  Administered 2018-11-04: 1 via ORAL
  Filled 2018-11-03: qty 1

## 2018-11-03 MED ORDER — SOD CITRATE-CITRIC ACID 500-334 MG/5ML PO SOLN: 30.0000 mL | ORAL | Status: DC | PRN

## 2018-11-03 MED ORDER — ONDANSETRON HCL 4 MG/2ML IJ SOLN: 4.0000 mg | INTRAMUSCULAR | Status: DC | PRN

## 2018-11-03 MED ORDER — ACETAMINOPHEN 325 MG PO TABS
650.0000 mg | ORAL_TABLET | ORAL | Status: DC | PRN
  Administered 2018-11-03 – 2018-11-04 (×4): 650 mg via ORAL
  Filled 2018-11-03 (×4): qty 2

## 2018-11-03 MED ORDER — METHYLERGONOVINE MALEATE 0.2 MG/ML IJ SOLN: 0.2000 mg | INTRAMUSCULAR | Status: DC | PRN

## 2018-11-03 MED ORDER — ZOLPIDEM TARTRATE 5 MG PO TABS: 5.0000 mg | ORAL_TABLET | Freq: Every evening | ORAL | Status: DC | PRN

## 2018-11-03 MED ORDER — PHENYLEPHRINE 40 MCG/ML (10ML) SYRINGE FOR IV PUSH (FOR BLOOD PRESSURE SUPPORT)
80.0000 ug | PREFILLED_SYRINGE | INTRAVENOUS | Status: DC | PRN
  Filled 2018-11-03 (×2): qty 10

## 2018-11-03 MED ORDER — DIPHENHYDRAMINE HCL 25 MG PO CAPS: 25.0000 mg | ORAL_CAPSULE | Freq: Four times a day (QID) | ORAL | Status: DC | PRN

## 2018-11-03 MED ORDER — PHENYLEPHRINE 40 MCG/ML (10ML) SYRINGE FOR IV PUSH (FOR BLOOD PRESSURE SUPPORT)
80.0000 ug | PREFILLED_SYRINGE | INTRAVENOUS | Status: DC | PRN
  Filled 2018-11-03: qty 10

## 2018-11-03 MED ORDER — SIMETHICONE 80 MG PO CHEW: 80.0000 mg | CHEWABLE_TABLET | ORAL | Status: DC | PRN

## 2018-11-03 MED ORDER — LACTATED RINGERS IV SOLN
500.0000 mL | INTRAVENOUS | Status: DC | PRN
  Administered 2018-11-03: 500 mL via INTRAVENOUS

## 2018-11-03 MED ORDER — OXYCODONE HCL 5 MG PO TABS: 10.0000 mg | ORAL_TABLET | ORAL | Status: DC | PRN

## 2018-11-03 MED ORDER — TERBUTALINE SULFATE 1 MG/ML IJ SOLN
0.2500 mg | Freq: Once | INTRAMUSCULAR | Status: DC | PRN
  Filled 2018-11-03: qty 1

## 2018-11-03 MED ORDER — BENZOCAINE-MENTHOL 20-0.5 % EX AERO
1.0000 "application " | INHALATION_SPRAY | CUTANEOUS | Status: DC | PRN
  Filled 2018-11-03: qty 56

## 2018-11-03 MED ORDER — OXYTOCIN 40 UNITS IN NORMAL SALINE INFUSION - SIMPLE MED
2.5000 [IU]/h | INTRAVENOUS | Status: DC
  Administered 2018-11-03: 2.5 [IU]/h via INTRAVENOUS

## 2018-11-03 MED ORDER — ONDANSETRON HCL 4 MG PO TABS: 4.0000 mg | ORAL_TABLET | ORAL | Status: DC | PRN

## 2018-11-03 MED ORDER — ACETAMINOPHEN 325 MG PO TABS
650.0000 mg | ORAL_TABLET | ORAL | Status: DC | PRN
  Administered 2018-11-03: 650 mg via ORAL
  Filled 2018-11-03: qty 2

## 2018-11-03 MED ORDER — OSELTAMIVIR PHOSPHATE 75 MG PO CAPS
75.0000 mg | ORAL_CAPSULE | Freq: Every day | ORAL | Status: DC
  Administered 2018-11-04: 75 mg via ORAL
  Filled 2018-11-03 (×2): qty 1

## 2018-11-03 MED ORDER — METHYLERGONOVINE MALEATE 0.2 MG PO TABS: 0.2000 mg | ORAL_TABLET | ORAL | Status: DC | PRN

## 2018-11-03 MED ORDER — BUTORPHANOL TARTRATE 1 MG/ML IJ SOLN: 1.0000 mg | INTRAMUSCULAR | Status: DC | PRN

## 2018-11-03 MED ORDER — OXYCODONE-ACETAMINOPHEN 5-325 MG PO TABS: 2.0000 | ORAL_TABLET | ORAL | Status: DC | PRN

## 2018-11-03 MED ORDER — OXYTOCIN 40 UNITS IN NORMAL SALINE INFUSION - SIMPLE MED
1.0000 m[IU]/min | INTRAVENOUS | Status: DC
  Administered 2018-11-03: 2 m[IU]/min via INTRAVENOUS
  Filled 2018-11-03: qty 1000

## 2018-11-03 MED ORDER — FENTANYL 2.5 MCG/ML BUPIVACAINE 1/10 % EPIDURAL INFUSION (WH - ANES)
14.0000 mL/h | INTRAMUSCULAR | Status: DC | PRN
  Administered 2018-11-03: 14 mL/h via EPIDURAL
  Filled 2018-11-03: qty 100

## 2018-11-03 MED ORDER — OXYCODONE HCL 5 MG PO TABS
5.0000 mg | ORAL_TABLET | ORAL | Status: DC | PRN
  Administered 2018-11-04 – 2018-11-05 (×3): 5 mg via ORAL
  Filled 2018-11-03 (×3): qty 1

## 2018-11-03 NOTE — Anesthesia Preprocedure Evaluation (Signed)
Anesthesia Evaluation  Patient identified by MRN, date of birth, ID band Patient awake    Reviewed: Allergy & Precautions, Patient's Chart, lab work & pertinent test results  Airway Mallampati: II  TM Distance: >3 FB Neck ROM: Full    Dental no notable dental hx. (+) Teeth Intact   Pulmonary shortness of breath and with exertion, former smoker,    Pulmonary exam normal        Cardiovascular negative cardio ROS Normal cardiovascular exam Rhythm:Regular Rate:Normal     Neuro/Psych  Headaches, negative psych ROS   GI/Hepatic negative GI ROS, Neg liver ROS,   Endo/Other  Obesity  Renal/GU negative Renal ROS  negative genitourinary   Musculoskeletal negative musculoskeletal ROS (+)   Abdominal Normal abdominal exam  (+) + obese,   Peds  Hematology negative hematology ROS (+)   Anesthesia Other Findings   Reproductive/Obstetrics                             Anesthesia Physical Anesthesia Plan  ASA: II  Anesthesia Plan: Epidural   Post-op Pain Management:    Induction:   PONV Risk Score and Plan:   Airway Management Planned: Natural Airway  Additional Equipment:   Intra-op Plan:   Post-operative Plan:   Informed Consent: I have reviewed the patients History and Physical, chart, labs and discussed the procedure including the risks, benefits and alternatives for the proposed anesthesia with the patient or authorized representative who has indicated his/her understanding and acceptance.       Plan Discussed with: Anesthesiologist  Anesthesia Plan Comments:         Anesthesia Quick Evaluation

## 2018-11-03 NOTE — Anesthesia Procedure Notes (Signed)
Epidural Patient location during procedure: OB Start time: 11/03/2018 1:25 PM End time: 11/03/2018 1:33 PM  Staffing Anesthesiologist: Mal Amabile, MD Performed: anesthesiologist   Preanesthetic Checklist Completed: patient identified, site marked, surgical consent, pre-op evaluation, timeout performed, IV checked, risks and benefits discussed and monitors and equipment checked  Epidural Patient position: sitting Prep: site prepped and draped and DuraPrep Patient monitoring: continuous pulse ox and blood pressure Approach: midline Location: L3-L4 Injection technique: LOR air  Needle:  Needle type: Tuohy  Needle gauge: 17 G Needle length: 9 cm and 9 Needle insertion depth: 4 cm Catheter type: closed end flexible Catheter size: 19 Gauge Catheter at skin depth: 9 cm Test dose: negative and Other  Assessment Events: blood not aspirated, injection not painful, no injection resistance, negative IV test and no paresthesia  Additional Notes Patient identified. Risks and benefits discussed including failed block, incomplete  Pain control, post dural puncture headache, nerve damage, paralysis, blood pressure Changes, nausea, vomiting, reactions to medications-both toxic and allergic and post Partum back pain. All questions were answered. Patient expressed understanding and wished to proceed. Sterile technique was used throughout procedure. Epidural site was Dressed with sterile barrier dressing. No paresthesias, signs of intravascular injection Or signs of intrathecal spread were encountered.  Patient was more comfortable after the epidural was dosed. Please see RN's note for documentation of vital signs and FHR which are stable.

## 2018-11-03 NOTE — H&P (Signed)
Alicia Jones is a 33 y.o. female, G3 P1011, EGA [redacted] weeks with [facility] 1-24 presenting for elective induction.  Prenatal care essentially uncomplicated.  OB History    Gravida  3   Para  1   Term  1   Preterm      AB  1   Living  1     SAB  1   TAB      Ectopic      Multiple  0   Live Births  1          Past Medical History:  Diagnosis Date  . Abdominal pain   . Abdominal pain, periumbilical   . Acute tonsillitis 09/13/2008  . ALLERGIC RHINITIS 02/07/2008  . Chest pain   . Chest pain   . Constipation   . Diarrhea   . Easy bruising   . Generalized headaches   . MIGRAINE HEADACHE 02/03/2010  . Nausea and vomiting   . SPRAIN&STRAIN OTH SPEC SITES SHOULDER&UPPER ARM 02/03/2010  . SVD (spontaneous vaginal delivery) 05/29/2016  . Weakness   . Weight loss, unintentional    Past Surgical History:  Procedure Laterality Date  . CHOLECYSTECTOMY  07/2011  . LAPAROSCOPIC CHOLECYSTECTOMY W/ CHOLANGIOGRAPHY  07/12/11   Family History: family history includes Breast cancer in her maternal grandmother; Cancer in her maternal grandmother, paternal grandfather, and paternal grandmother; Diabetes in her father; Gallbladder disease in her father; Liver disease in her father. Social History:  reports that she quit smoking about 9 months ago. Her smoking use included cigarettes. She smoked 0.50 packs per day. She has never used smokeless tobacco. She reports previous alcohol use. She reports that she does not use drugs.     Maternal Diabetes: No Genetic Screening: Normal Maternal Ultrasounds/Referrals: Normal Fetal Ultrasounds or other Referrals:  None Maternal Substance Abuse:  No Significant Maternal Medications:  None Significant Maternal Lab Results:  Lab values include: Group B Strep negative Other Comments:  None  Review of Systems  Respiratory: Negative.   Cardiovascular: Negative.    Maternal Medical History:  Fetal activity: Perceived fetal activity is normal.     Prenatal complications: no prenatal complications Prenatal Complications - Diabetes: none.   AROM clear Dilation: 4 Effacement (%): 70, 80 Station: -1 Exam by:: Dr. Jackelyn Knife Blood pressure 116/71, pulse 65, temperature 97.8 F (36.6 C), temperature source Oral, resp. rate 16, height 5\' 3"  (1.6 m), weight 77.5 kg, last menstrual period 01/27/2018, unknown if currently breastfeeding. Maternal Exam:  Uterine Assessment: Contraction strength is mild.  Contraction frequency is irregular.   Abdomen: Patient reports no abdominal tenderness. Estimated fetal weight is 7 lbs.   Fetal presentation: vertex  Introitus: Normal vulva. Normal vagina.  Amniotic fluid character: clear.  Pelvis: adequate for delivery.   Cervix: Cervix evaluated by digital exam.     Fetal Exam Fetal Monitor Review: Mode: ultrasound.   Baseline rate: 120.  Variability: moderate (6-25 bpm).   Pattern: accelerations present and no decelerations.    Fetal State Assessment: Category I - tracings are normal.     Physical Exam  Vitals reviewed. Constitutional: She appears well-developed and well-nourished.  Cardiovascular: Normal rate and regular rhythm.  Respiratory: Effort normal. No respiratory distress.  GI: Soft.  Genitourinary:    Vulva normal.     Prenatal labs: ABO, Rh: --/--/B POS (01/17 1002) Antibody: NEG (01/17 1002) Rubella: Immune (06/19 0000) RPR: Nonreactive (06/19 0000)  HBsAg: Negative (06/19 0000)  HIV: Non-reactive (06/19 0000)  GBS: Negative (12/31  0000)   Assessment/Plan: IUP at 39 weeks for elective induction.  On pitocin, AROM done, will monitor progress, anticipate SVD   Leighton Roachodd D Danna Casella 11/03/2018, 1:04 PM

## 2018-11-03 NOTE — Anesthesia Pain Management Evaluation Note (Signed)
  [facility] Pain Management Visit Note  Patient: Alicia Jones, 33 y.o., female  "Hello I am a member of the anesthesia team at [facility]. We have an anesthesia team available at all times to provide care throughout the hospital, including epidural management and anesthesia for C-section. I don't know your plan for the delivery whether it a natural birth, water birth, IV sedation, nitrous supplementation, doula or epidural, but we want to meet your pain goals."   1.Was your pain managed to your expectations on prior hospitalizations?   Yes   2.What is your expectation for pain management during this hospitalization?     Epidural  3.How can we help you reach that goal? Epidural in place  Record the patient's initial score and the patient's pain goal.   Pain: 2  Pain Goal: 3 [facility] wants you to be able to say your pain was always managed very well.  Jeven Topper 11/03/2018

## 2018-11-04 LAB — RPR: RPR Ser Ql: NONREACTIVE

## 2018-11-04 NOTE — Lactation Note (Addendum)
This note was copied from a baby's chart. Lactation Consultation Note  Patient Name: Alicia Jones [id number] Date: 11/04/2018 Reason for consult: Initial assessment;Term P2, 8 hour female infant. Per mom, infant had 2 voids and one stool since delivery. Per mom, she has DEBP at home. Mom's goal is to breastfeed infant for one year. Per mom, she stopped breastfeeding her son at 5 months due returning to work. Mom has infant latched in cradle hold, LC noticed infant on tip of mom's nipple with shallow latched. Mom agreeable break latched and LC discussed with mom to  notice that her nipple which was pinched when infant came off breast and was not well rounded. Mom re-latched infant, with deeper latched, infant mouth, wide gape and nose touching breast. Infant was breastfeeding 25 minutes and still breastfeeding when LC left room. Per mom, she could feel  a difference with latch that it was much better and not painful. Mom stated she had bruised and bloody nipples in the beginning with her son. Mom feels that this breastfeeding experience is much better. Mom knows to breastfeed according hunger cues and not exceed 3 hours without breastfeeding infant. LC discussed I & O Mom knows to call Nurse or LC if she has any questions, concerns or needs further assistance with latching infant to breast.  .Mom made aware of O/P services, breastfeeding support groups, community resources, and our phone # for post-discharge questions.  Maternal Data Formula Feeding for Exclusion: No Has patient been taught Hand Expression?: Yes Does the patient have breastfeeding experience prior to this delivery?: Yes  Feeding Feeding Type: Breast Fed  LATCH Score Latch: Grasps breast easily, tongue down, lips flanged, rhythmical sucking.  Audible Swallowing: Spontaneous and intermittent  Type of Nipple: Everted at rest and after stimulation  Comfort (Breast/Nipple): Soft / non-tender  Hold (Positioning):  Assistance needed to correctly position infant at breast and maintain latch.  LATCH Score: 9  Interventions Interventions: Breast feeding basics reviewed;Skin to skin;Breast compression;Breast massage;Assisted with latch  Lactation Tools Discussed/Used WIC Program: No   Consult Status Consult Status: Follow-up Date: 11/05/18    Danelle Earthly 11/04/2018, 1:44 AM

## 2018-11-04 NOTE — Anesthesia Postprocedure Evaluation (Signed)
Anesthesia Post Note  Patient: Alicia Jones  Procedure(s) Performed: AN AD HOC LABOR EPIDURAL     Patient location during evaluation: Mother Baby Anesthesia Type: Epidural Level of consciousness: awake and alert Pain management: pain level controlled Vital Signs Assessment: post-procedure vital signs reviewed and stable Respiratory status: spontaneous breathing, nonlabored ventilation and respiratory function stable Cardiovascular status: stable Postop Assessment: no headache, no backache, epidural receding and patient able to bend at knees Anesthetic complications: no    Last Vitals:  Vitals:   11/03/18 1925 11/04/18 0307  BP: 119/84 103/70  Pulse: 67 68  Resp: 18 18  Temp: (!) 36.3 C 36.7 C    Last Pain:  Vitals:   11/04/18 0618  TempSrc:   PainSc: 0-No pain   Pain Goal:                   Rica Records

## 2018-11-04 NOTE — Progress Notes (Signed)
PPD #1 No problems Afeb, VSS Fundus firm, NT at U-1 Continue routine postpartum care 

## 2018-11-05 MED ORDER — IBUPROFEN 600 MG PO TABS: 600.0000 mg | ORAL_TABLET | Freq: Four times a day (QID) | ORAL | 0 refills | Status: DC

## 2018-11-05 NOTE — Progress Notes (Signed)
PPD #2 Doing well Afeb, VSS D/c home 

## 2018-11-05 NOTE — Lactation Note (Signed)
This note was copied from a baby's chart. Lactation Consultation Note  Patient Name: Alicia Jones [id number] Date: 11/05/2018 Reason for consult: Follow-up assessment;Term;Infant weight loss  P2 mother whose infant is now 48 hours old. This baby has an 8% weight loss.  Mother breast fed her first child for 5 months.  Baby in mother's arms and sleeping when I arrived.  Mother had no questions/concerns related to breast feeding.  She feels like feedings are going well so far.    Mother will continue feeding 8-12 times/24 hours or sooner if baby shows cues.  Engorgement prevention/treatment discussed.  Mother had engorgement with her first child.  Manual pump with instructions for use given.    Mother has a DEBP for home use.  Informed her of our breast feeding support group and OP lactation services.  She has our OP phone number for questions/concerns after discharge.  Father present.  Family ready for discharge.  RN notified.   Maternal Data Formula Feeding for Exclusion: No Has patient been taught Hand Expression?: Yes Does the patient have breastfeeding experience prior to this delivery?: Yes  Feeding Feeding Type: Breast Fed  LATCH Score Latch: Grasps breast easily, tongue down, lips flanged, rhythmical sucking.  Audible Swallowing: A few with stimulation  Type of Nipple: Everted at rest and after stimulation  Comfort (Breast/Nipple): Soft / non-tender  Hold (Positioning): No assistance needed to correctly position infant at breast.  LATCH Score: 9  Interventions    Lactation Tools Discussed/Used WIC Program: No Pump Review: Setup, frequency, and cleaning;Milk Storage Initiated by:: Jaimeson Gopal Date initiated:: 11/05/18   Consult Status Consult Status: Complete Date: 11/05/18 Follow-up type: Call as needed    Kyree Fedorko R Marzell Allemand 11/05/2018, 10:21 AM

## 2018-11-05 NOTE — Discharge Instructions (Signed)
As per discharge pamphlet °

## 2018-11-05 NOTE — Discharge Summary (Signed)
OB Discharge Summary     Patient Name: Alicia Jones DOB: 1986-03-24 MRN: [id number]  Date of admission: 11/03/2018 Delivering MD: Jackelyn Knife, Lonette Stevison   Date of discharge: 11/05/2018  Admitting diagnosis: 39wks direct admit Intrauterine pregnancy: [redacted]w[redacted]d     Secondary diagnosis:  Active Problems:   SVD (spontaneous vaginal delivery)   Indication for care in labor or delivery      Discharge diagnosis: Term Pregnancy Delivered                                   Hospital course:  Induction of Labor With Vaginal Delivery   33 y.o. yo [id number] at [redacted]w[redacted]d was admitted to the hospital 11/03/2018 for induction of labor.  Indication for induction: Favorable cervix at term.  Patient had an uncomplicated labor course as follows: Membrane Rupture Time/Date: 12:56 PM ,11/03/2018   Intrapartum Procedures: Episiotomy: None [1]                                         Lacerations:  2nd degree [3]  Patient had delivery of a Viable infant.  Information for the patient's newborn:  Venecia, Endress [[id number]]  Delivery Method: Vag-Spont   11/03/2018  Details of delivery can be found in separate delivery note.  Patient had a routine postpartum course. Patient is discharged home 11/05/18.  Physical exam  Vitals:   11/04/18 0800 11/04/18 1342 11/04/18 2230 11/05/18 0520  BP: 117/71 113/69 114/78 112/79  Pulse: 76 67 70 67  Resp: 16 18 18 18   Temp:    97.7 F (36.5 C)  TempSrc:    Oral  SpO2: 98% 100% 99% 98%  Weight:      Height:       General: alert Lochia: appropriate Uterine Fundus: firm  Labs: Lab Results  Component Value Date   WBC 6.6 11/03/2018   HGB 12.2 11/03/2018   HCT 34.7 (L) 11/03/2018   MCV 97.5 11/03/2018   PLT 185 11/03/2018   CMP Latest Ref Rng & Units 11/05/2015  Glucose 70 - 99 mg/dL 12(X)  BUN 6 - 23 mg/dL 6  Creatinine 5.17 - 0.01 mg/dL 7.49  Sodium 449 - 675 mEq/L 133(L)  Potassium 3.5 - 5.1 mEq/L 4.2  Chloride 96 - 112 mEq/L 99  CO2 19 - 32 mEq/L 26  Calcium  8.4 - 10.5 mg/dL 9.0  Total Protein 6.0 - 8.3 g/dL -  Total Bilirubin 0.2 - 1.2 mg/dL -  Alkaline Phos 39 - 916 U/L -  AST 0 - 37 U/L -  ALT 0 - 35 U/L -    Discharge instruction: per After Visit Summary and "Baby and Me Booklet".  After visit meds:  Allergies as of 11/05/2018   No Known Allergies     Medication List    STOP taking these medications   Insulin Pen Needle 31G X 8 MM Misc   liraglutide 18 MG/3ML Sopn Commonly known as:  VICTOZA     TAKE these medications   ibuprofen 600 MG tablet Commonly known as:  ADVIL,MOTRIN Take 1 tablet (600 mg total) by mouth every 6 (six) hours.   oseltamivir 75 MG capsule Commonly known as:  TAMIFLU Take 75 mg by mouth daily.   PNV PO Take by mouth.       Diet: routine diet  Activity: Advance as tolerated. Pelvic rest for 6 weeks.   Outpatient follow up:6 weeks  Newborn Data: Live born female  Birth Weight: 6 lb 12.6 oz (3080 g) APGAR: 9, 9  Newborn Delivery   Birth date/time:  11/03/2018 16:40:00 Delivery type:  Vaginal, Spontaneous     Baby Feeding: Breast Disposition:home with mother   11/05/2018 Zenaida Nieceodd D Emil Weigold, MD

## 2018-11-06 DIAGNOSIS — Z00110 Health examination for newborn under 8 days old: Secondary | ICD-10-CM | POA: Diagnosis not present

## 2018-11-06 DIAGNOSIS — R634 Abnormal weight loss: Secondary | ICD-10-CM | POA: Diagnosis not present

## 2018-11-07 ENCOUNTER — Inpatient Hospital Stay (HOSPITAL_COMMUNITY): Admission: RE | Admit: 2018-11-07 | Payer: BLUE CROSS/BLUE SHIELD | Source: Ambulatory Visit

## 2018-11-07 DIAGNOSIS — R634 Abnormal weight loss: Secondary | ICD-10-CM | POA: Diagnosis not present

## 2018-11-09 DIAGNOSIS — R634 Abnormal weight loss: Secondary | ICD-10-CM | POA: Diagnosis not present

## 2018-11-16 DIAGNOSIS — R634 Abnormal weight loss: Secondary | ICD-10-CM | POA: Diagnosis not present

## 2018-11-26 DIAGNOSIS — J069 Acute upper respiratory infection, unspecified: Secondary | ICD-10-CM | POA: Diagnosis not present

## 2019-03-05 ENCOUNTER — Encounter: Payer: Self-pay | Admitting: Internal Medicine

## 2019-03-05 ENCOUNTER — Ambulatory Visit (INDEPENDENT_AMBULATORY_CARE_PROVIDER_SITE_OTHER): Payer: BLUE CROSS/BLUE SHIELD | Admitting: Internal Medicine

## 2019-03-05 ENCOUNTER — Telehealth: Payer: Self-pay | Admitting: Internal Medicine

## 2019-03-05 DIAGNOSIS — J309 Allergic rhinitis, unspecified: Secondary | ICD-10-CM

## 2019-03-05 DIAGNOSIS — G43809 Other migraine, not intractable, without status migrainosus: Secondary | ICD-10-CM | POA: Diagnosis not present

## 2019-03-05 DIAGNOSIS — E6609 Other obesity due to excess calories: Secondary | ICD-10-CM

## 2019-03-05 MED ORDER — LIRAGLUTIDE 18 MG/3ML ~~LOC~~ SOPN: 1.2000 mg | PEN_INJECTOR | Freq: Every day | SUBCUTANEOUS | 3 refills | Status: DC

## 2019-03-05 NOTE — Assessment & Plan Note (Signed)
Ok for claritin 10 qd prn,  to f/u any worsening symptoms or concerns

## 2019-03-05 NOTE — Progress Notes (Signed)
Patient ID: Alicia Jones, female   DOB: 05/22/1986, 33 y.o.   MRN: [id number]  Virtual Visit via Video Note  I connected with [name] on 03/05/19 at  9:40 AM EDT by a video enabled telemedicine application and verified that I am speaking with the correct person using two identifiers.  Location: Patient: at home with 2 children < 2yo Provider: at office   I discussed the limitations of evaluation and management by telemedicine and the availability of in person appointments. The patient expressed understanding and agreed to proceed.  History of Present Illness: Here to f/u 4 mo post partum with now 2 healthy children doing well, with c/o persistent wt gain and unable to lose despite breastfeeding, at least 15 lbs.  Also incidentally during prior victoza dosing and then pregnancy, her recurring "weak spells" has stopped, and has now had a few since then. Pt denies chest pain, increased sob or doe, wheezing, orthopnea, PND, increased LE swelling, palpitations, dizziness or syncope.  Pt denies new neurological symptoms such as new headache, or facial or extremity weakness or numbness   Pt denies polydipsia, polyuria.  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  May be able to go back in 1 wk she thinks if phase 2 is allowed.  Also mentions Does have several wks ongoing nasal allergy symptoms with clearish congestion, itch and sneezing, without fever, pain, ST, cough, swelling or wheezing, but is wary of antihistamines due to sedation and taking care of the kids Past Medical History:  Diagnosis Date  . Abdominal pain   . Abdominal pain, periumbilical   . Acute tonsillitis 09/13/2008  . ALLERGIC RHINITIS 02/07/2008  . Chest pain   . Chest pain   . Constipation   . Diarrhea   . Easy bruising   . Generalized headaches   . MIGRAINE  HEADACHE 02/03/2010  . Nausea and vomiting   . SPRAIN&STRAIN OTH SPEC SITES SHOULDER&UPPER ARM 02/03/2010  . SVD (spontaneous vaginal delivery) 05/29/2016  . Weakness   . Weight loss, unintentional    Past Surgical History:  Procedure Laterality Date  . CHOLECYSTECTOMY  07/2011  . LAPAROSCOPIC CHOLECYSTECTOMY W/ CHOLANGIOGRAPHY  07/12/11    reports that she quit smoking about 13 months ago. Her smoking use included cigarettes. She smoked 0.50 packs per day. She has never used smokeless tobacco. She reports previous alcohol use. She reports that she does not use drugs. family history includes Breast cancer in her maternal grandmother; Cancer in her maternal grandmother, paternal grandfather, and paternal grandmother; Diabetes in her father; Gallbladder disease in her father; Liver disease in her father. No Known Allergies Current Outpatient Medications on File Prior to Visit  Medication Sig Dispense Refill  . ibuprofen (ADVIL,MOTRIN) 600 MG tablet Take 1 tablet (600 mg total) by mouth every 6 (six) hours. 30 tablet 0  . Prenatal Vit w/Fe-Methylfol-FA (PNV PO) Take by mouth.     No current facility-administered medications on file prior to visit.     Observations/Objective: Alert, NAD, appropriate mood and affect, resps normal, cn 2-12 intact, moves all 4s, no visible rash or swelling Lab Results  Component Value Date   WBC 6.6 11/03/2018   HGB 12.2 11/03/2018   HCT 34.7 (L) 11/03/2018   PLT 185 11/03/2018   GLUCOSE 65 (L) 11/05/2015  ALT 16 04/02/2014   AST 19 04/02/2014   NA 133 (L) 11/05/2015   K 4.2 11/05/2015   CL 99 11/05/2015   CREATININE 0.56 11/05/2015   BUN 6 11/05/2015   CO2 26 11/05/2015   TSH 2.09 05/20/2011   INR 0.9 04/02/2014   Assessment and Plan: See notes  Follow Up Instructions: See notes   I discussed the assessment and treatment plan with the patient. The patient was provided an opportunity to ask questions and all were answered. The patient agreed  with the plan and demonstrated an understanding of the instructions.   The patient was advised to call back or seek an in-person evaluation if the symptoms worsen or if the condition fails to improve as anticipated.  Oliver BarreJames , MD

## 2019-03-05 NOTE — Patient Instructions (Signed)
Please take all new medication as prescribed - the Victoza  Also OK to take claritin 10 mg OTC daily as needed for allergies  Please continue all other medications as before, and refills have been done if requested.  Please have the pharmacy call with any other refills you may need.  Please continue your efforts at being more active, low cholesterol diet  Please keep your appointments with your specialists as you may have planned

## 2019-03-05 NOTE — Assessment & Plan Note (Signed)
Ok for [employer] asd, to check with pharmacist as well regarding use with breastfeeding, to f/u any worsening symptoms or concerns

## 2019-03-05 NOTE — Telephone Encounter (Signed)
Copied from CRM [id number]. Topic: Quick Communication - Rx Refill/Question >> Mar 05, 2019 10:09 AM Fanny Bien wrote: Medication: pt called and stated that she would like needles called in for VICTOZA. Please advise

## 2019-03-05 NOTE — Telephone Encounter (Signed)
Copied from CRM [id number]. Topic: Quick Communication - Rx Refill/Question >> Mar 05, 2019 10:46 AM Angela Nevin wrote: Medication: liraglutide (VICTOZA) 18 MG/3ML SOPN  [place], With [facility], requesting clarification on the quantity of this medication.

## 2019-03-05 NOTE — Assessment & Plan Note (Signed)
stable overall by history and exam, recent data reviewed with pt, and pt to continue medical treatment as before,  to f/u any worsening symptoms or concerns  

## 2019-03-06 ENCOUNTER — Telehealth: Payer: Self-pay

## 2019-03-06 MED ORDER — LIRAGLUTIDE 18 MG/3ML ~~LOC~~ SOPN: 1.2000 mg | PEN_INJECTOR | Freq: Every day | SUBCUTANEOUS | 3 refills | Status: DC

## 2019-03-06 NOTE — Telephone Encounter (Signed)
Copied from CRM [id number]. Topic: General - Inquiry >> Mar 06, 2019  3:08 PM Reggie Pile, NT wrote: Reason for CRM: Patient is calling in stating she would like a coupon code for her medication that was liraglutide (VICTOZA) 18 MG/3ML SOPN, as mentioned by pharmacy. Call back is [phone].

## 2019-03-06 NOTE — Addendum Note (Signed)
Addended by: [name] on: 03/06/2019 12:46 PM   Modules accepted: Orders

## 2019-03-06 NOTE — Telephone Encounter (Signed)
rx redone erx

## 2019-03-06 NOTE — Telephone Encounter (Signed)
I am not able to help with this  Shirron, would you be able to assist or find out about this?  thanks

## 2019-03-07 NOTE — Telephone Encounter (Signed)
Spoke to pt for clarification. She stated that the copay is a little over $200 and the pharmacy informed her to get in contact with her insurance company or physician. I instructed the patient to touch base with her insurance carrier and see what similar medications they cover like Victoza that would be in her price point. She stated that she would follow up with the with their suggestions.

## 2019-03-19 ENCOUNTER — Telehealth: Payer: Self-pay | Admitting: Internal Medicine

## 2019-03-19 MED ORDER — INSULIN PEN NEEDLE 32G X 6 MM MISC: 1.0000 | Freq: Every day | 1 refills | Status: DC

## 2019-03-19 NOTE — Telephone Encounter (Signed)
Copied from CRM [id number]. Topic: Quick Communication - Rx Refill/Question >> Mar 19, 2019  2:23 PM Alicia Jones wrote: Medication: The patient cannot take the liraglutide without needles. She needs a prescription for insulin needles covered by her insurance  Has the patient contacted their pharmacy? Yes.   (Agent: If yes, when and what did the pharmacy advise?) The medication was called in again not the needles which is what the patient need. Please call in some needles.   Preferred Pharmacy (with phone number or street name): [id number] - [place], [place] - [address] AT [facility] [address] & [facility] [address] [phone] (Phone) [phone] (Fax)    Agent: Please be advised that RX refills may take up to 3 business days. We ask that you follow-up with your pharmacy.

## 2019-03-19 NOTE — Telephone Encounter (Signed)
Rx sent for pen needles. See meds.

## 2019-07-28 IMAGING — US US MFM OB DETAIL+14 WK
1 series · 14 of 28 positions shown · non-contrast
Comparison: none

[Series 1: us mfm ob detail+14 wk · 14 of 86 slices shown]
[im 4/86]
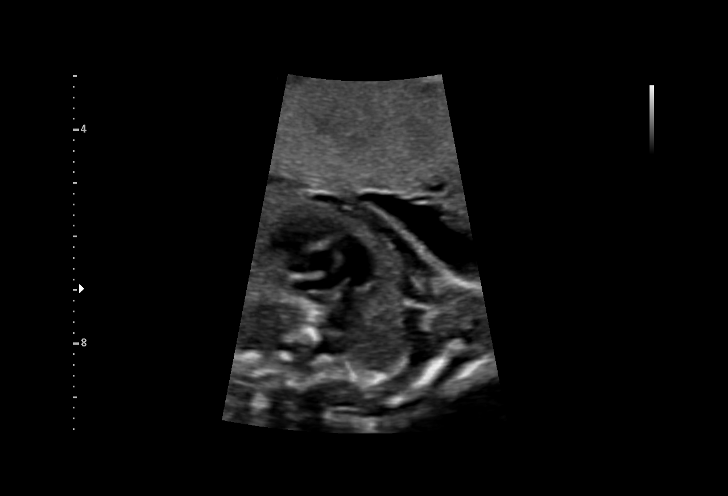
[im 10/86]
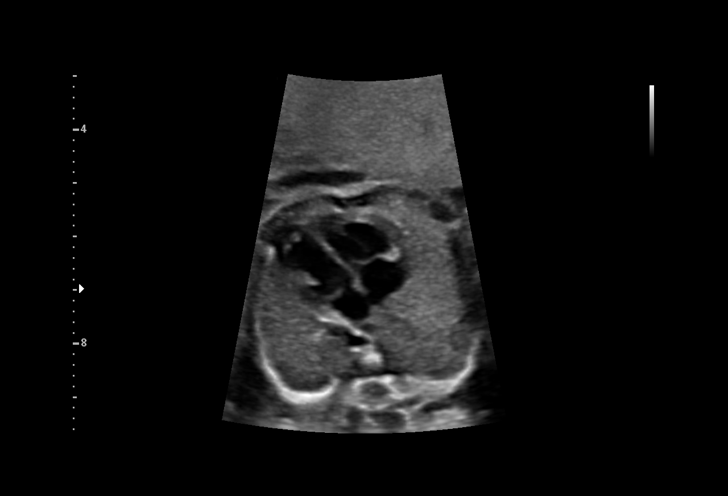
[im 16/86]
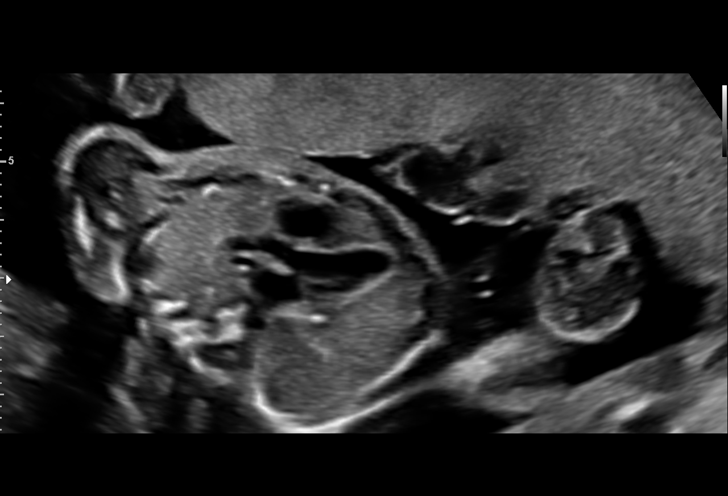
[im 23/86]
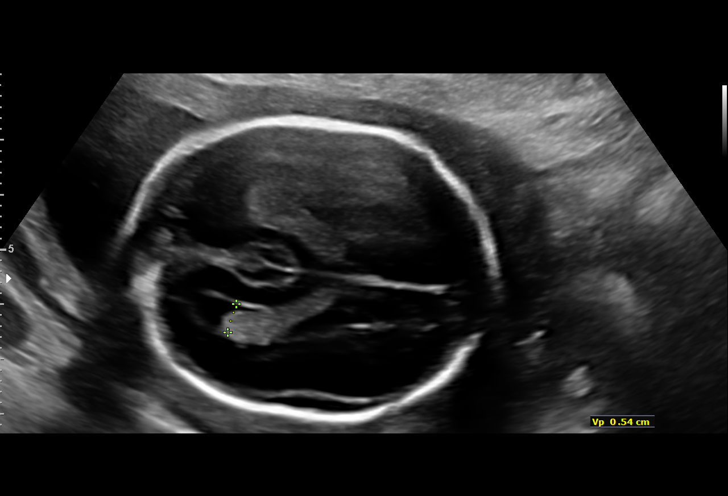
[im 29/86]
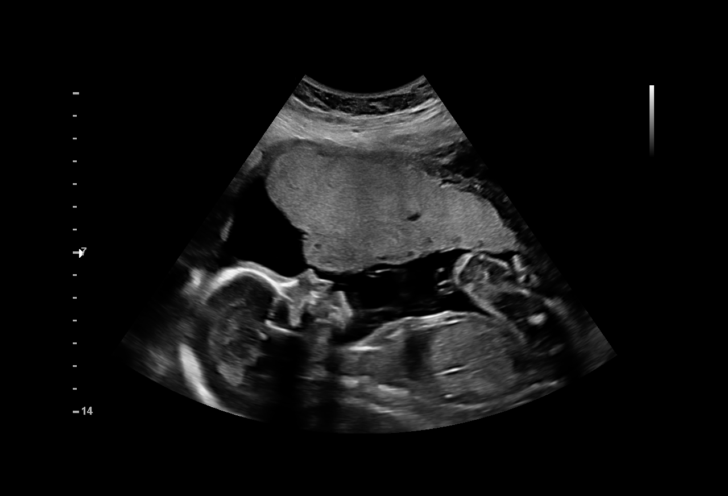
[im 35/86]
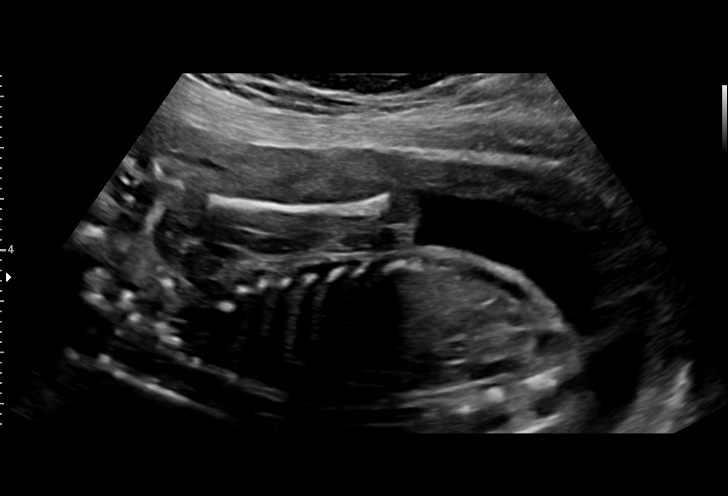
[im 41/86]
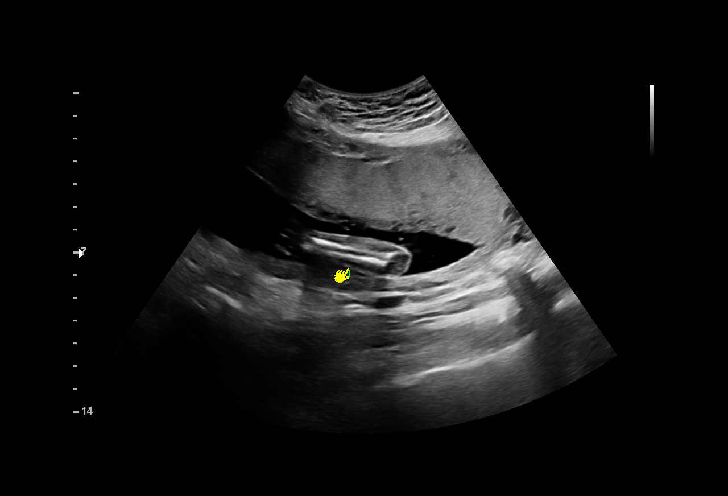
[im 48/86]
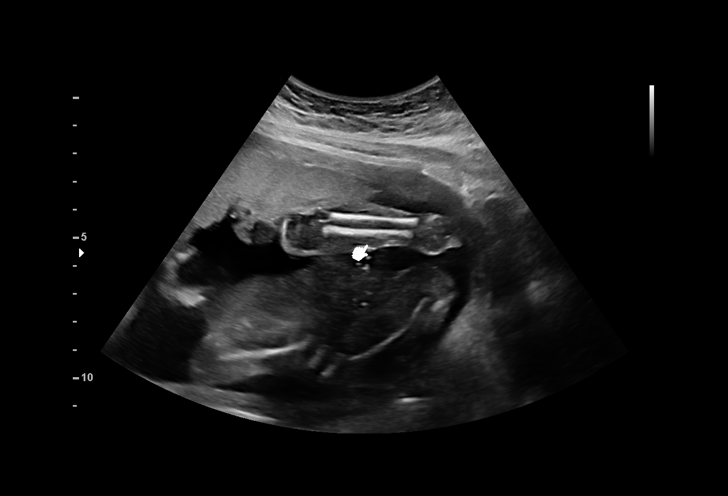
[im 54/86]
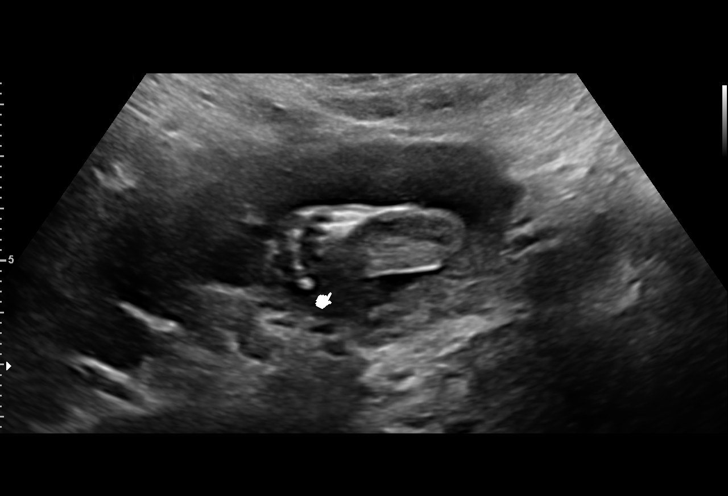
[im 60/86]
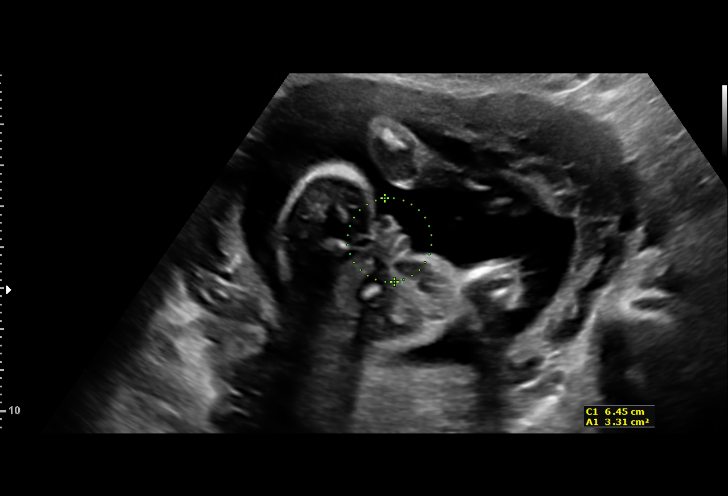
[im 67/86]
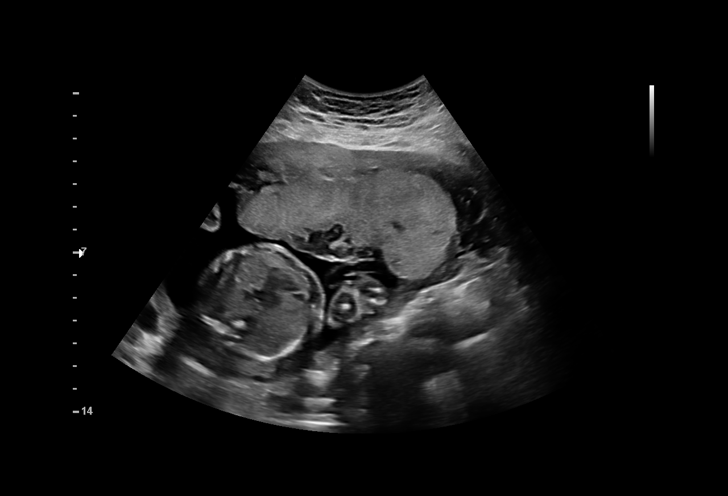
[im 73/86]
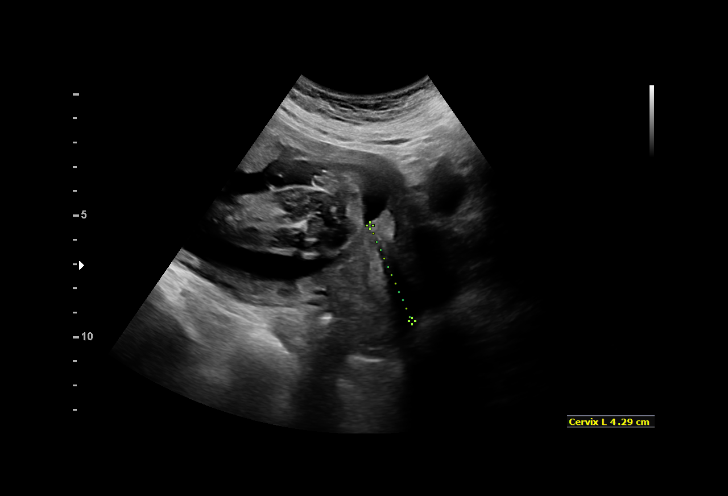
[im 79/86]
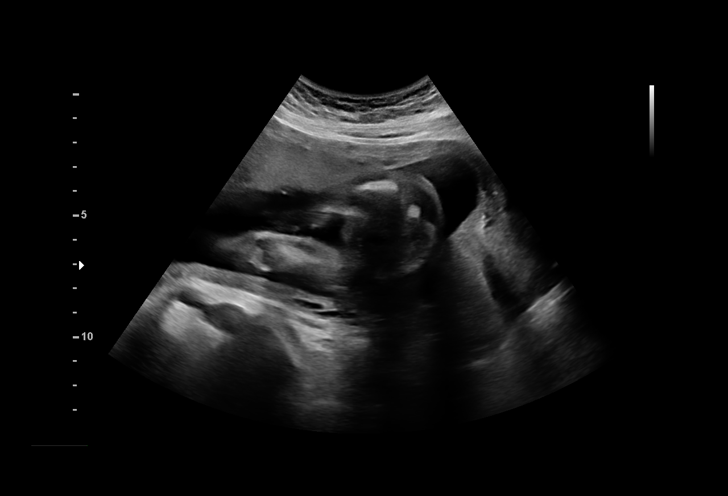
[im 86/86]
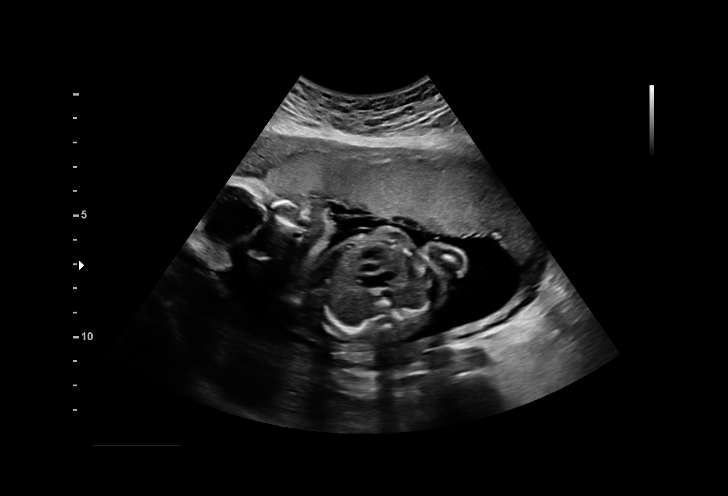

[14 of 28 positions shown; findings below may reference images not displayed]

#101

Indications

Fetal abnormality - other known or
suspected (unable to get good views of
lower spine)
22 weeks gestation of pregnancy
Encounter for antenatal screening for
malformations
Vital Signs

BMI:         26.75        Pulse:  79
BP:          102/65
Fetal Evaluation

Num Of Fetuses:         1
Fetal Heart Rate(bpm):  154
Cardiac Activity:       Observed
Presentation:           Breech
Placenta:               Anterior
P. Cord Insertion:      Visualized

Amniotic Fluid
AFI FV:      Within normal limits

Largest Pocket(cm)
4.24
Biometry

BPD:      56.7  mm     G. Age:  23w 2d         64  %    CI:         78.1   %    70 - 86
FL/HC:      18.7   %    19.2 -
HC:       203   mm     G. Age:  22w 3d         21  %    HC/AC:      1.10        1.05 -
AC:      184.7  mm     G. Age:  23w 2d         55  %    FL/BPD:     67.0   %    71 - 87
FL:         38  mm     G. Age:  22w 1d         19  %    FL/AC:      20.6   %    20 - 24
HUM:      36.3  mm     G. Age:  22w 5d         38  %
CER:      24.7  mm     G. Age:  22w 5d         48  %

LV:        5.4  mm
CM:        6.6  mm

Est. FW:     533  gm      1 lb 3 oz     52  %
OB History

Gravidity:    3         Term:   1         SAB:   1
Living:       1
Gestational Age

LMP:           23w 6d        Date:  01/27/18                 EDD:   11/03/18
U/S Today:     22w 6d                                        EDD:   11/10/18
Best:          22w 6d     Det. By:  Early Ultrasound         EDD:   11/10/18
(04/05/18)
Anatomy

Cranium:               Appears normal         Aortic Arch:            Appears normal
Cavum:                 Appears normal         Ductal Arch:            Appears normal
Ventricles:            Appears normal         Diaphragm:              Appears normal
Choroid Plexus:        Appears normal         Stomach:                Appears normal, left
sided
Cerebellum:            Appears normal         Abdomen:                Appears normal
Posterior Fossa:       Appears normal         Abdominal Wall:         Appears nml (cord
insert, abd wall)
Nuchal Fold:           Not applicable (>20    Cord Vessels:           Appears normal (3
wks GA)                                        vessel cord)
Face:                  Appears normal         Kidneys:                Appear normal
(orbits and profile)
Lips:                  Appears normal         Bladder:                Appears normal
Thoracic:              Appears normal         Spine:                  Not well visualized
Heart:                 Appears normal         Upper Extremities:      Appears normal
(4CH, axis, and situs
RVOT:                  Appears normal         Lower Extremities:      Appears normal
LVOT:                  Appears normal

Other:  Fetus appears to be female. Heels visualized. Nasal bone visualized.
Technically difficult due to fetal position.
Cervix Uterus Adnexa

Cervix
Length:           4.29  cm.
Normal appearance by transabdominal scan.

Uterus
No abnormality visualized.

Left Ovary
Not visualized.

Right Ovary
Size(cm)       3.3  x   2.4    x  2         Vol(ml):
Within normal limits.
Comments

U/S images reviewed. Findings reviewed with patient.
Appropriate fetal growth is noted.  No fetal abnormalities are
seen.  Spine was poorly visualized due to fetal position.
Questions answered.
10 minutes spent face to face with patient.
Recommendations: 1) Follow-up U/S in 4 weeks to complete
anatomy
Recommendations

1) Follow-up U/S in 4 weeks to complete anatomy

## 2019-07-31 DIAGNOSIS — Z23 Encounter for immunization: Secondary | ICD-10-CM | POA: Diagnosis not present

## 2019-07-31 DIAGNOSIS — R102 Pelvic and perineal pain: Secondary | ICD-10-CM | POA: Diagnosis not present

## 2019-10-16 ENCOUNTER — Telehealth: Payer: Self-pay

## 2019-10-16 DIAGNOSIS — L089 Local infection of the skin and subcutaneous tissue, unspecified: Secondary | ICD-10-CM | POA: Diagnosis not present

## 2019-10-16 DIAGNOSIS — L03011 Cellulitis of right finger: Secondary | ICD-10-CM | POA: Diagnosis not present

## 2019-10-16 NOTE — Telephone Encounter (Signed)
Message from Plan: Key [id number] Effective from 10/16/2019 through 10/14/2020.  **Approved today via Cover My Meds**

## 2019-10-18 ENCOUNTER — Other Ambulatory Visit: Payer: Self-pay

## 2019-10-18 ENCOUNTER — Ambulatory Visit: Payer: BC Managed Care – PPO | Admitting: Internal Medicine

## 2019-10-18 ENCOUNTER — Encounter: Payer: Self-pay | Admitting: Internal Medicine

## 2019-10-18 DIAGNOSIS — J309 Allergic rhinitis, unspecified: Secondary | ICD-10-CM

## 2019-10-18 NOTE — Patient Instructions (Signed)
none

## 2019-10-18 NOTE — Progress Notes (Signed)
Patient ID: Alicia Jones, female   DOB: 1985-10-20, 33 y.o.   MRN: [id number]  Not seen - error

## 2019-11-08 DIAGNOSIS — Z79899 Other long term (current) drug therapy: Secondary | ICD-10-CM | POA: Diagnosis not present

## 2019-11-08 DIAGNOSIS — Z20828 Contact with and (suspected) exposure to other viral communicable diseases: Secondary | ICD-10-CM | POA: Diagnosis not present

## 2019-11-08 DIAGNOSIS — L700 Acne vulgaris: Secondary | ICD-10-CM | POA: Diagnosis not present

## 2019-11-08 DIAGNOSIS — Z03818 Encounter for observation for suspected exposure to other biological agents ruled out: Secondary | ICD-10-CM | POA: Diagnosis not present

## 2019-11-29 ENCOUNTER — Other Ambulatory Visit: Payer: Self-pay

## 2019-11-29 ENCOUNTER — Encounter: Payer: Self-pay | Admitting: Internal Medicine

## 2019-11-29 ENCOUNTER — Ambulatory Visit (INDEPENDENT_AMBULATORY_CARE_PROVIDER_SITE_OTHER): Payer: BC Managed Care – PPO | Admitting: Internal Medicine

## 2019-11-29 ENCOUNTER — Encounter: Payer: BC Managed Care – PPO | Admitting: Internal Medicine

## 2019-11-29 VITALS — BP 110/74 | HR 74 | Temp 98.4°F | Ht 63.0 in | Wt 160.6 lb

## 2019-11-29 DIAGNOSIS — G5601 Carpal tunnel syndrome, right upper limb: Secondary | ICD-10-CM | POA: Insufficient documentation

## 2019-11-29 DIAGNOSIS — E6609 Other obesity due to excess calories: Secondary | ICD-10-CM | POA: Diagnosis not present

## 2019-11-29 DIAGNOSIS — Z0000 Encounter for general adult medical examination without abnormal findings: Secondary | ICD-10-CM | POA: Diagnosis not present

## 2019-11-29 LAB — URINALYSIS, ROUTINE W REFLEX MICROSCOPIC
Bilirubin Urine: NEGATIVE
Hgb urine dipstick: NEGATIVE
Ketones, ur: NEGATIVE
Leukocytes,Ua: NEGATIVE
Nitrite: NEGATIVE
RBC / HPF: NONE SEEN (ref 0–?)
Specific Gravity, Urine: 1.02 (ref 1.000–1.030)
Total Protein, Urine: NEGATIVE
Urine Glucose: NEGATIVE
Urobilinogen, UA: 0.2 (ref 0.0–1.0)
WBC, UA: NONE SEEN (ref 0–?)
pH: 7 (ref 5.0–8.0)

## 2019-11-29 LAB — CBC WITH DIFFERENTIAL/PLATELET
Basophils Absolute: 0 10*3/uL (ref 0.0–0.1)
Basophils Relative: 0.6 % (ref 0.0–3.0)
Eosinophils Absolute: 0.1 10*3/uL (ref 0.0–0.7)
Eosinophils Relative: 1.5 % (ref 0.0–5.0)
HCT: 43.5 % (ref 36.0–46.0)
Hemoglobin: 15.1 g/dL — ABNORMAL HIGH (ref 12.0–15.0)
Lymphocytes Relative: 27.6 % (ref 12.0–46.0)
Lymphs Abs: 1.3 10*3/uL (ref 0.7–4.0)
MCHC: 34.7 g/dL (ref 30.0–36.0)
MCV: 94.3 fl (ref 78.0–100.0)
Monocytes Absolute: 0.3 10*3/uL (ref 0.1–1.0)
Monocytes Relative: 7.3 % (ref 3.0–12.0)
Neutro Abs: 2.9 10*3/uL (ref 1.4–7.7)
Neutrophils Relative %: 63 % (ref 43.0–77.0)
Platelets: 244 10*3/uL (ref 150.0–400.0)
RBC: 4.62 Mil/uL (ref 3.87–5.11)
RDW: 12.7 % (ref 11.5–15.5)
WBC: 4.6 10*3/uL (ref 4.0–10.5)

## 2019-11-29 LAB — HEPATIC FUNCTION PANEL
ALT: 11 U/L (ref 0–35)
AST: 14 U/L (ref 0–37)
Albumin: 4.5 g/dL (ref 3.5–5.2)
Alkaline Phosphatase: 82 U/L (ref 39–117)
Bilirubin, Direct: 0.1 mg/dL (ref 0.0–0.3)
Total Bilirubin: 0.5 mg/dL (ref 0.2–1.2)
Total Protein: 7.4 g/dL (ref 6.0–8.3)

## 2019-11-29 LAB — TSH: TSH: 1.77 u[IU]/mL (ref 0.35–4.50)

## 2019-11-29 LAB — BASIC METABOLIC PANEL
BUN: 15 mg/dL (ref 6–23)
CO2: 31 mEq/L (ref 19–32)
Calcium: 9.5 mg/dL (ref 8.4–10.5)
Chloride: 102 mEq/L (ref 96–112)
Creatinine, Ser: 0.79 mg/dL (ref 0.40–1.20)
GFR: 84.28 mL/min (ref >60.00)
Glucose, Bld: 79 mg/dL (ref 70–99)
Potassium: 4.6 mEq/L (ref 3.5–5.1)
Sodium: 138 mEq/L (ref 135–145)

## 2019-11-29 LAB — LIPID PANEL
Cholesterol: 160 mg/dL (ref 0–200)
HDL: 66.6 mg/dL (ref >39.00)
LDL Cholesterol: 79 mg/dL (ref 0–99)
NonHDL: 93.32
Total CHOL/HDL Ratio: 2
Triglycerides: 72 mg/dL (ref 0.0–149.0)
VLDL: 14.4 mg/dL (ref 0.0–40.0)

## 2019-11-29 MED ORDER — PHENTERMINE HCL 37.5 MG PO CAPS: 37.5000 mg | ORAL_CAPSULE | ORAL | 2 refills | Status: DC

## 2019-11-29 NOTE — Assessment & Plan Note (Signed)
Mild, for qhs right wrist splint

## 2019-11-29 NOTE — Patient Instructions (Addendum)
You are on the [facility] wait list  Please also go to [email or web address] for the county wait list  OK to wear the right wrist brace at night only to help with the right carpal tunnel symptoms  Please take all new medication as prescribed - the phentermine  Please continue all other medications as before, and refills have been done if requested.  Please have the pharmacy call with any other refills you may need.  Please continue your efforts at being more active, low cholesterol diet, and weight control.  You are otherwise up to date with prevention measures today.  Please keep your appointments with your specialists as you may have planned  Please go to the LAB at the blood drawing area for the tests to be done  You will be contacted by phone if any changes need to be made immediately.  Otherwise, you will receive a letter about your results with an explanation, but please check with MyChart first.  Please remember to sign up for MyChart if you have not done so, as this will be important to you in the future with finding out test results, communicating by private email, and scheduling acute appointments online when needed.  Please make an Appointment to return for your 1 year visit, or sooner if needed

## 2019-11-29 NOTE — Progress Notes (Signed)
Subjective:    Patient ID: Alicia Jones, female    DOB: 1986/04/17, 34 y.o.   MRN: [id number]  HPI   Here for wellness and f/u;  Overall doing ok;  Pt denies Chest pain, worsening SOB, DOE, wheezing, orthopnea, PND, worsening LE edema, palpitations, dizziness or syncope.  Pt denies neurological change such as new headache, facial or extremity weakness.  Pt denies polydipsia, polyuria, or low sugar symptoms. Pt states overall good compliance with treatment and medications, good tolerability, and has been trying to follow appropriate diet.  Pt denies worsening depressive symptoms, suicidal ideation or panic. No fever, night sweats, wt loss, loss of appetite, or other constitutional symptoms.  Pt states good ability with ADL's, has low fall risk, home safety reviewed and adequate, no other significant changes in hearing or vision, and only occasionally active with exercise. 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  Wearing brace during the day sometimes but still a problem Past Medical History:  Diagnosis Date  . Abdominal pain   . Abdominal pain, periumbilical   . Acute tonsillitis 09/13/2008  . ALLERGIC RHINITIS 02/07/2008  . Chest pain   . Chest pain   . Constipation   . Diarrhea   . Easy bruising   . Generalized headaches   . MIGRAINE HEADACHE 02/03/2010  . Nausea and vomiting   . SPRAIN&STRAIN OTH SPEC SITES SHOULDER&UPPER ARM 02/03/2010  . SVD (spontaneous vaginal delivery) 05/29/2016  . Weakness   . Weight loss, unintentional    Past Surgical History:  Procedure Laterality Date  . CHOLECYSTECTOMY  07/2011  . LAPAROSCOPIC CHOLECYSTECTOMY W/ CHOLANGIOGRAPHY  07/12/11    reports that she quit smoking about 22 months ago. Her smoking use included cigarettes. She smoked  0.50 packs per day. She has never used smokeless tobacco. She reports previous alcohol use. She reports that she does not use drugs. family history includes Breast cancer in her maternal grandmother; Cancer in her maternal grandmother, paternal grandfather, and paternal grandmother; Diabetes in her father; Gallbladder disease in her father; Liver disease in her father. No Known Allergies Current Outpatient Medications on File Prior to Visit  Medication Sig Dispense Refill  . ibuprofen (ADVIL,MOTRIN) 600 MG tablet Take 1 tablet (600 mg total) by mouth every 6 (six) hours. 30 tablet 0  . Insulin Pen Needle 32G X 6 MM MISC 1 each by Does not apply route daily. 90 each 1  . liraglutide (VICTOZA) 18 MG/3ML SOPN Inject 0.2 mLs (1.2 mg total) into the skin daily. 18 mL 3  . Prenatal Vit w/Fe-Methylfol-FA (PNV PO) Take by mouth.     No current facility-administered medications on file prior to visit.   Review of Systems All otherwise neg per pt     Objective:   Physical Exam BP 110/74   Pulse 74   Temp 98.4 F (36.9 C)   Ht 5\' 3"  (1.6 m)   Wt 160 lb 9.6 oz (72.8 kg)   SpO2 99%   BMI 28.45 kg/m  VS noted,  Constitutional: Pt appears in NAD HENT: Head: NCAT.  Right Ear: External ear normal.  Left Ear: External ear normal.  Eyes: . Pupils are equal, round, and reactive to light. Conjunctivae and EOM are normal  Nose: without d/c or deformity Neck: Neck supple. Gross normal ROM Cardiovascular: Normal rate and regular rhythm.   Pulmonary/Chest: Effort normal and breath sounds without rales or wheezing.  Abd:  Soft, NT, ND, + BS, no organomegaly Neurological: Pt is alert. At baseline orientation, motor grossly intact Skin: Skin is warm. No rashes, other new lesions, no LE edema Psychiatric: Pt behavior is normal without agitation  All otherwise neg per pt Lab Results  Component Value Date   WBC 4.6 11/29/2019   HGB 15.1 (H) 11/29/2019   HCT 43.5 11/29/2019   PLT 244.0 11/29/2019    GLUCOSE 79 11/29/2019   CHOL 160 11/29/2019   TRIG 72.0 11/29/2019   HDL 66.60 11/29/2019   LDLCALC 79 11/29/2019   ALT 11 11/29/2019   AST 14 11/29/2019   NA 138 11/29/2019   K 4.6 11/29/2019   CL 102 11/29/2019   CREATININE 0.79 11/29/2019   BUN 15 11/29/2019   CO2 31 11/29/2019   TSH 1.77 11/29/2019   INR 0.9 04/02/2014      Assessment & Plan:

## 2019-11-29 NOTE — Assessment & Plan Note (Signed)

## 2019-11-29 NOTE — Assessment & Plan Note (Signed)
For phentermine asd 

## 2019-12-25 ENCOUNTER — Ambulatory Visit (INDEPENDENT_AMBULATORY_CARE_PROVIDER_SITE_OTHER): Payer: BC Managed Care – PPO | Admitting: Internal Medicine

## 2019-12-25 ENCOUNTER — Encounter: Payer: Self-pay | Admitting: Internal Medicine

## 2019-12-25 DIAGNOSIS — R059 Cough, unspecified: Secondary | ICD-10-CM

## 2019-12-25 DIAGNOSIS — R05 Cough: Secondary | ICD-10-CM | POA: Diagnosis not present

## 2019-12-25 MED ORDER — BENZONATATE 200 MG PO CAPS: 200.0000 mg | ORAL_CAPSULE | Freq: Two times a day (BID) | ORAL | 0 refills | Status: DC | PRN

## 2019-12-25 NOTE — Progress Notes (Signed)
Virtual Visit via Video Note  I connected with Alicia Jones on 12/25/19 at  3:40 PM EST by a video enabled telemedicine application and verified that I am speaking with the correct person using two identifiers.  The patient and the provider were at separate locations throughout the entire encounter.   I discussed the limitations of evaluation and management by telemedicine and the availability of in person appointments. The patient expressed understanding and agreed to proceed. The patient and the provider were the only parties present for the visit unless noted in HPI below.  History of Present Illness: The patient is a 34 y.o. female with visit for congestion, cough, diarrhea. Started last Wednesday. Has sneezing and congestion. Denies fevers or chills. The diarrhea is now gone but still a little congestion and cough, worse at night time. Did get rapid covid-19 test over the weekend which was negative. Overall it is improving. Has tried some delsym otc for cough which is helping some. Denies sick contacts or known exposure to covid-19.  Observations/Objective: Appearance: normal, breathing appears normal, no coughing or dyspnea during visit, casual grooming, abdomen does not appear distended, throat with mild redness, memory normal, mental status is A and O times 3  Assessment and Plan: See problem oriented charting  Follow Up Instructions: rx tessalon perles, take allergy medication otc  I discussed the assessment and treatment plan with the patient. The patient was provided an opportunity to ask questions and all were answered. The patient agreed with the plan and demonstrated an understanding of the instructions.   The patient was advised to call back or seek an in-person evaluation if the symptoms worsen or if the condition fails to improve as anticipated.  Myrlene Broker, MD

## 2019-12-26 NOTE — Assessment & Plan Note (Signed)
Rx tessalon perles if needed. Use delsym and take allergy medication otc. Likely viral and no antibiotics indicated at this time.

## 2019-12-29 ENCOUNTER — Ambulatory Visit: Payer: BC Managed Care – PPO

## 2020-01-31 ENCOUNTER — Telehealth: Payer: Self-pay

## 2020-01-31 NOTE — Telephone Encounter (Signed)
Please advise 

## 2020-01-31 NOTE — Telephone Encounter (Signed)
Very sorry, but this medicaiton is not meant for long term use as the long term side effects are now known and the medication is not approved for long term use, thanks

## 2020-01-31 NOTE — Telephone Encounter (Signed)
New message    The patient calling   Pt c/o medication issue:  1. Name of Medication:phentermine 37.5 MG capsule  2. How are you currently taking this medication (dosage and times per day)? Once a day   3. Are you having a reaction (difficulty breathing--STAT)? No   4. What is your medication issue? Should medication be increase or should another medication be prescribe

## 2020-02-01 NOTE — Telephone Encounter (Signed)
Called pt no answer LMOM w/MD response../lmb 

## 2020-03-05 ENCOUNTER — Telehealth: Payer: Self-pay

## 2020-03-05 DIAGNOSIS — I83813 Varicose veins of bilateral lower extremities with pain: Secondary | ICD-10-CM

## 2020-03-05 NOTE — Telephone Encounter (Signed)
New message   1.Medication Requested:liraglutide (VICTOZA) 18 MG/3ML SOPN  Insulin Pen Needle 32G X 6 MM MISC  2. Pharmacy (Name, Street, City):[facility] [phone] - [place], [place] - [address]  3. On Med List: Yes   4. Last Visit with PCP: 2.11.2021  5. Next visit date with PCP: N/a    Agent: Please be advised that RX refills may take up to 3 business days. We ask that you follow-up with your pharmacy.

## 2020-03-05 NOTE — Telephone Encounter (Signed)
New message    The patient calls needs a    Referral to Vein specialist   Reason : Varicose veins   Facility: Open to suggestion

## 2020-03-06 MED ORDER — INSULIN PEN NEEDLE 32G X 6 MM MISC: 1.0000 | Freq: Every day | 1 refills | Status: DC

## 2020-03-06 MED ORDER — LIRAGLUTIDE 18 MG/3ML ~~LOC~~ SOPN: 1.2000 mg | PEN_INJECTOR | Freq: Every day | SUBCUTANEOUS | 3 refills | Status: DC

## 2020-03-06 NOTE — Telephone Encounter (Signed)
Reviewed chart pt is up-to-date sent refills to [facility].../lmb  

## 2020-03-06 NOTE — Telephone Encounter (Signed)
Please let pt know that treatment is only considered medically related if she has painful veins  If there is no pain, this is considered cosmetic and could be expensive  Just let me know

## 2020-03-11 NOTE — Telephone Encounter (Signed)
Ok referral done 

## 2020-06-17 DIAGNOSIS — I83813 Varicose veins of bilateral lower extremities with pain: Secondary | ICD-10-CM | POA: Insufficient documentation

## 2020-06-17 DIAGNOSIS — I872 Venous insufficiency (chronic) (peripheral): Secondary | ICD-10-CM | POA: Insufficient documentation

## 2020-12-22 ENCOUNTER — Ambulatory Visit (INDEPENDENT_AMBULATORY_CARE_PROVIDER_SITE_OTHER): Payer: 59 | Admitting: Orthopedic Surgery

## 2020-12-22 ENCOUNTER — Encounter: Payer: Self-pay | Admitting: Orthopedic Surgery

## 2020-12-22 VITALS — Ht 63.0 in | Wt 165.0 lb

## 2020-12-22 DIAGNOSIS — S92502A Displaced unspecified fracture of left lesser toe(s), initial encounter for closed fracture: Secondary | ICD-10-CM

## 2020-12-22 NOTE — Progress Notes (Signed)
Office Visit Note   Patient: Alicia Jones           Date of Birth: 07/16/1986           MRN: [id number] Visit Date: 12/22/2020              Requested by: [name], MD [address] [place],  [place] [address] PCP: [name], MD  Chief Complaint  Patient presents with  . Left Foot - Injury, Pain    DOI 12/16/2020      HPI: Patient is a 35 year old woman is seen for initial evaluation of a fracture proximal phalanx left little toe.  Patient states she stubbed her little toe on exercise equipment at home a week ago she went to urgent care was placed in a postoperative shoe and is seen for initial evaluation.  Assessment & Plan: Visit Diagnoses:  1. Closed fracture of phalanx of left fifth toe, initial encounter     Plan: Patient was given a mouse pad to decrease the elevation of the little toe there is no varus or valgus malalignment but there is slight extension.  She will continue with her postoperative shoe weightbearing as tolerated.  Follow-Up Instructions: Return in about 2 weeks (around 01/05/2021).   Ortho Exam  Patient is alert, oriented, no adenopathy, well-dressed, normal affect, normal respiratory effort. Examination patient has good pulses there is some ecchymosis and bruising in the third and fourth metatarsal heads she has some slight elevation of the little toe radiograph shows a fracture through the proximal phalanx left little toe.  A mouse pad was applied to decrease the elevation.  Attempted manipulation the bone did not move.  Imaging: No results found. No images are attached to the encounter.  Labs: Lab Results  Component Value Date   ESRSEDRATE 14 05/21/2011   LABORGA No Salmonella,Shigella,Campylobacter or Yersinia 05/21/2011   LABORGA isolated. 05/21/2011     Lab Results  Component Value Date   ALBUMIN 4.5 11/29/2019   ALBUMIN 4.0 04/02/2014   ALBUMIN 4.1 05/20/2011    No results found for: MG No results found for: VD25OH  No  results found for: PREALBUMIN CBC EXTENDED Latest Ref Rng & Units 11/29/2019 11/03/2018 05/30/2016  WBC 4.0 - 10.5 K/uL 4.6 6.6 9.7  RBC 3.87 - 5.11 Mil/uL 4.62 3.56(L) 2.83(L)  HGB 12.0 - 15.0 g/dL 15.1(H) 12.2 9.6(L)  HCT 36.0 - 46.0 % 43.5 34.7(L) 26.7(L)  PLT 150.0 - 400.0 K/uL 244.0 185 196  NEUTROABS 1.4 - 7.7 K/uL 2.9 - -  LYMPHSABS 0.7 - 4.0 K/uL 1.3 - -     Body mass index is 29.23 kg/m.  Orders:  No orders of the defined types were placed in this encounter.  No orders of the defined types were placed in this encounter.    Procedures: No procedures performed  Clinical Data: No additional findings.  ROS:  All other systems negative, except as noted in the HPI. Review of Systems  Objective: Vital Signs: Ht 5\' 3"  (1.6 m)   Wt 165 lb (74.8 kg)   BMI 29.23 kg/m   Specialty Comments:  No specialty comments available.  PMFS History: Patient Active Problem List   Diagnosis Date Noted  . Carpal tunnel syndrome of right wrist 11/29/2019  . Right carpal tunnel syndrome 11/29/2019  . Indication for care in labor or delivery 11/03/2018  . Cough 06/22/2017  . Dyspnea 06/22/2017  . Chest pain 06/22/2017  . Vaginal yeast infection 06/22/2017  .  Hematochezia 03/26/2017  . Diarrhea 03/23/2017  . Obesity 03/23/2017  . SVD (spontaneous vaginal delivery) 05/29/2016  . Long term current use of non-steroidal anti-inflammatories (NSAID) 05/21/2011  . Preventative health care 05/20/2011  . Migraine headache 02/03/2010  . Allergic rhinitis 02/07/2008   Past Medical History:  Diagnosis Date  . Abdominal pain   . Abdominal pain, periumbilical   . Acute tonsillitis 09/13/2008  . ALLERGIC RHINITIS 02/07/2008  . Chest pain   . Chest pain   . Constipation   . Diarrhea   . Easy bruising   . Generalized headaches   . MIGRAINE HEADACHE 02/03/2010  . Nausea and vomiting   . SPRAIN&STRAIN OTH SPEC SITES SHOULDER&UPPER ARM 02/03/2010  . SVD (spontaneous vaginal delivery)  05/29/2016  . Weakness   . Weight loss, unintentional     Family History  Problem Relation Age of Onset  . Diabetes Father   . Gallbladder disease Father   . Liver disease Father   . Breast cancer Maternal Grandmother   . Cancer Maternal Grandmother        breast  . Cancer Paternal Grandmother        breast  . Cancer Paternal Grandfather        lung  . Colon cancer Neg Hx     Past Surgical History:  Procedure Laterality Date  . CHOLECYSTECTOMY  07/2011  . LAPAROSCOPIC CHOLECYSTECTOMY W/ CHOLANGIOGRAPHY  07/12/11   Social History   Occupational History  . Occupation: hairdresser and Psychologist, prison and probation services  . Occupation: Engineer, maintenance (IT): [employer]  Tobacco Use  . Smoking status: Former Smoker    Packs/day: 0.50    Types: Cigarettes    Quit date: 01/10/2018    Years since quitting: 2.9  . Smokeless tobacco: Never Used  Vaping Use  . Vaping Use: Never used  Substance and Sexual Activity  . Alcohol use: Not Currently    Comment: social  . Drug use: No  . Sexual activity: Yes    Birth control/protection: None

## 2021-01-07 ENCOUNTER — Encounter: Payer: Self-pay | Admitting: Physician Assistant

## 2021-01-07 ENCOUNTER — Ambulatory Visit (INDEPENDENT_AMBULATORY_CARE_PROVIDER_SITE_OTHER): Payer: 59 | Admitting: Physician Assistant

## 2021-01-07 DIAGNOSIS — S92502A Displaced unspecified fracture of left lesser toe(s), initial encounter for closed fracture: Secondary | ICD-10-CM

## 2021-01-07 NOTE — Progress Notes (Signed)
Office Visit Note   Patient: Alicia Jones           Date of Birth: 03-20-1986           MRN: [id number] Visit Date: 01/07/2021              Requested by: [name], MD [address] [place],  [place] [address] PCP: [name], MD  Chief Complaint  Patient presents with  . Left Foot - Fracture    Fx left 5th proximal phalanx      HPI: Patient presents today for follow-up on her fifth phalanx fracture.  She says the toe still hurts some sensitivity but has had improvement she is concerned because her fourth toe seems to been just to touch she did notice this before open fracture of the fifth toe.  It is not painful to her.  She has been using her mouse pad  Assessment & Plan: Visit Diagnoses: No diagnosis found.  Plan: Make an appointment for 4 weeks can cancel this if she is doing well at that point she could go back to high activities  Follow-Up Instructions: No follow-ups on file.   Ortho Exam  Patient is alert, oriented, no adenopathy, well-dressed, normal affect, normal respiratory effort. Examination of her foot no erythema no cellulitis.  Clinically the fourth and fifth toes appear with well-maintained alignment she still has some resolving swelling and ecchymosis on the fifth toe less tender to palpation per her.  With regards to her fourth toe slightly more curvature than the fourth toe on the unaffected side.  I did offer to retake x-rays today if she was concerned she declined.  Toes are warm and pink with brisk capillary refill  Imaging: No results found. No images are attached to the encounter.  Labs: Lab Results  Component Value Date   ESRSEDRATE 14 05/21/2011   LABORGA No Salmonella,Shigella,Campylobacter or Yersinia 05/21/2011   LABORGA isolated. 05/21/2011     Lab Results  Component Value Date   ALBUMIN 4.5 11/29/2019   ALBUMIN 4.0 04/02/2014   ALBUMIN 4.1 05/20/2011    No results found for: MG No results found for: VD25OH  No  results found for: PREALBUMIN CBC EXTENDED Latest Ref Rng & Units 11/29/2019 11/03/2018 05/30/2016  WBC 4.0 - 10.5 K/uL 4.6 6.6 9.7  RBC 3.87 - 5.11 Mil/uL 4.62 3.56(L) 2.83(L)  HGB 12.0 - 15.0 g/dL 15.1(H) 12.2 9.6(L)  HCT 36.0 - 46.0 % 43.5 34.7(L) 26.7(L)  PLT 150.0 - 400.0 K/uL 244.0 185 196  NEUTROABS 1.4 - 7.7 K/uL 2.9 - -  LYMPHSABS 0.7 - 4.0 K/uL 1.3 - -     There is no height or weight on file to calculate BMI.  Orders:  No orders of the defined types were placed in this encounter.  No orders of the defined types were placed in this encounter.    Procedures: No procedures performed  Clinical Data: No additional findings.  ROS:  All other systems negative, except as noted in the HPI. Review of Systems  Objective: Vital Signs: There were no vitals taken for this visit.  Specialty Comments:  No specialty comments available.  PMFS History: Patient Active Problem List   Diagnosis Date Noted  . Carpal tunnel syndrome of right wrist 11/29/2019  . Right carpal tunnel syndrome 11/29/2019  . Indication for care in labor or delivery 11/03/2018  . Cough 06/22/2017  . Dyspnea 06/22/2017  . Chest pain 06/22/2017  . Vaginal yeast infection  06/22/2017  . Hematochezia 03/26/2017  . Diarrhea 03/23/2017  . Obesity 03/23/2017  . SVD (spontaneous vaginal delivery) 05/29/2016  . Long term current use of non-steroidal anti-inflammatories (NSAID) 05/21/2011  . Preventative health care 05/20/2011  . Migraine headache 02/03/2010  . Allergic rhinitis 02/07/2008   Past Medical History:  Diagnosis Date  . Abdominal pain   . Abdominal pain, periumbilical   . Acute tonsillitis 09/13/2008  . ALLERGIC RHINITIS 02/07/2008  . Chest pain   . Chest pain   . Constipation   . Diarrhea   . Easy bruising   . Generalized headaches   . MIGRAINE HEADACHE 02/03/2010  . Nausea and vomiting   . SPRAIN&STRAIN OTH SPEC SITES SHOULDER&UPPER ARM 02/03/2010  . SVD (spontaneous vaginal delivery)  05/29/2016  . Weakness   . Weight loss, unintentional     Family History  Problem Relation Age of Onset  . Diabetes Father   . Gallbladder disease Father   . Liver disease Father   . Breast cancer Maternal Grandmother   . Cancer Maternal Grandmother        breast  . Cancer Paternal Grandmother        breast  . Cancer Paternal Grandfather        lung  . Colon cancer Neg Hx     Past Surgical History:  Procedure Laterality Date  . CHOLECYSTECTOMY  07/2011  . LAPAROSCOPIC CHOLECYSTECTOMY W/ CHOLANGIOGRAPHY  07/12/11   Social History   Occupational History  . Occupation: hairdresser and Psychologist, prison and probation services  . Occupation: Engineer, maintenance (IT): [employer]  Tobacco Use  . Smoking status: Former Smoker    Packs/day: 0.50    Types: Cigarettes    Quit date: 01/10/2018    Years since quitting: 2.9  . Smokeless tobacco: Never Used  Vaping Use  . Vaping Use: Never used  Substance and Sexual Activity  . Alcohol use: Not Currently    Comment: social  . Drug use: No  . Sexual activity: Yes    Birth control/protection: None

## 2021-01-22 ENCOUNTER — Ambulatory Visit: Payer: 59 | Admitting: Orthopaedic Surgery

## 2021-01-27 ENCOUNTER — Encounter: Payer: Self-pay | Admitting: Orthopaedic Surgery

## 2021-01-27 ENCOUNTER — Other Ambulatory Visit: Payer: Self-pay

## 2021-01-27 ENCOUNTER — Ambulatory Visit (INDEPENDENT_AMBULATORY_CARE_PROVIDER_SITE_OTHER): Payer: 59 | Admitting: Orthopaedic Surgery

## 2021-01-27 ENCOUNTER — Ambulatory Visit: Payer: 59 | Admitting: Orthopaedic Surgery

## 2021-01-27 DIAGNOSIS — M79642 Pain in left hand: Secondary | ICD-10-CM

## 2021-01-27 DIAGNOSIS — M79641 Pain in right hand: Secondary | ICD-10-CM | POA: Diagnosis not present

## 2021-01-27 NOTE — Progress Notes (Signed)
Office Visit Note   Patient: Alicia Jones           Date of Birth: [birth year]           MRN: [id number] Visit Date: 01/27/2021              Requested by: [name], MD [address] [place],  [place] [address] PCP: [name], MD   Assessment & Plan: Visit Diagnoses:  1. Bilateral hand pain     Plan: Impression is chronic bilateral hand pain and paresthesias right greater than left.  At this point, I believe it is appropriate to refer her to neurology for further work-up.  She will follow up with [place] as needed.  Follow-Up Instructions: Return if symptoms worsen or fail to improve.   Orders:  No orders of the defined types were placed in this encounter.  No orders of the defined types were placed in this encounter.     Procedures: No procedures performed   Clinical Data: No additional findings.   Subjective: Chief Complaint  Patient presents with  . Right Hand - Pain  . 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  She has been dealing with this for several months.  She complains of pain and paresthesias to the volar wrist and into the thumb, index and long fingers right greater than left.  Her symptoms appear to be worse when working.  She does note slight relief when she takes a week off of work.  She has seen Dr. Merlyn Lot in the past.  She has previously undergone nerve conduction studies which apparently were normal.  She has had a carpal tunnel injection on the right which only helped for a few days.  Steroid taper only helped for a few days as well.  She has tried carpal tunnel bracing as well as arm sleeves and compression gloves without relief.  Autoimmune panel was negative other than slightly elevated CRP.  She denies any cervical spine pathology  Cervical spine and hand x-rays were both  unremarkable.  Review of Systems as detailed in HPI.  All others reviewed and are negative.  Objective: Vital Signs: There were no vitals taken for this visit.  Physical Exam well-developed well-nourished female no acute distress.  Alert oriented x3.  Ortho Exam bilateral hand exam shows a positive Phalen and Tinel on the right, positive Tinel on the left.  No thenar atrophy.  Full grip strength.  She is neurovascular intact distally.  Specialty Comments:  No specialty comments available.  Imaging: No new imaging   PMFS History: Patient Active Problem List   Diagnosis Date Noted  . Carpal tunnel syndrome of right wrist 11/29/2019  . Right carpal tunnel syndrome 11/29/2019  . Indication for care in labor or delivery 11/03/2018  . Cough 06/22/2017  . Dyspnea 06/22/2017  . Chest pain 06/22/2017  . Vaginal yeast infection 06/22/2017  . Hematochezia 03/26/2017  . Diarrhea 03/23/2017  . Obesity 03/23/2017  . SVD (spontaneous vaginal delivery) 05/29/2016  . Long term current use of non-steroidal anti-inflammatories (NSAID) 05/21/2011  . Preventative health care 05/20/2011  . Migraine headache 02/03/2010  . Allergic rhinitis 02/07/2008   Past Medical History:  Diagnosis Date  . Abdominal pain   . Abdominal pain, periumbilical   . Acute tonsillitis 09/13/2008  . ALLERGIC RHINITIS 02/07/2008  .  Chest pain   . Chest pain   . Constipation   . Diarrhea   . Easy bruising   . Generalized headaches   . MIGRAINE HEADACHE 02/03/2010  . Nausea and vomiting   . SPRAIN&STRAIN OTH SPEC SITES SHOULDER&UPPER ARM 02/03/2010  . SVD (spontaneous vaginal delivery) 05/29/2016  . Weakness   . Weight loss, unintentional     Family History  Problem Relation Age of Onset  . Diabetes Father   . Gallbladder disease Father   . Liver disease Father   . Breast cancer Maternal Grandmother   . Cancer Maternal Grandmother        breast  . Cancer Paternal Grandmother        breast  . Cancer  Paternal Grandfather        lung  . Colon cancer Neg Hx     Past Surgical History:  Procedure Laterality Date  . CHOLECYSTECTOMY  07/2011  . LAPAROSCOPIC CHOLECYSTECTOMY W/ CHOLANGIOGRAPHY  07/12/11   Social History   Occupational History  . Occupation: hairdresser and Psychologist, prison and probation services  . Occupation: Engineer, maintenance (IT): [employer]  Tobacco Use  . Smoking status: Former Smoker    Packs/day: 0.50    Types: Cigarettes    Quit date: 01/10/2018    Years since quitting: 3.0  . Smokeless tobacco: Never Used  Vaping Use  . Vaping Use: Never used  Substance and Sexual Activity  . Alcohol use: Not Currently    Comment: social  . Drug use: No  . Sexual activity: Yes    Birth control/protection: None

## 2021-01-28 NOTE — Addendum Note (Signed)
Addended by: Albertina Parr on: 01/28/2021 02:32 PM   Modules accepted: Orders

## 2021-01-29 ENCOUNTER — Encounter: Payer: Self-pay | Admitting: Neurology

## 2021-02-16 ENCOUNTER — Ambulatory Visit (INDEPENDENT_AMBULATORY_CARE_PROVIDER_SITE_OTHER): Payer: 59 | Admitting: Neurology

## 2021-02-16 ENCOUNTER — Ambulatory Visit (INDEPENDENT_AMBULATORY_CARE_PROVIDER_SITE_OTHER): Payer: 59 | Admitting: Obstetrics and Gynecology

## 2021-02-16 ENCOUNTER — Encounter: Payer: Self-pay | Admitting: Obstetrics and Gynecology

## 2021-02-16 ENCOUNTER — Encounter: Payer: Self-pay | Admitting: Neurology

## 2021-02-16 ENCOUNTER — Other Ambulatory Visit: Payer: Self-pay

## 2021-02-16 ENCOUNTER — Other Ambulatory Visit (HOSPITAL_COMMUNITY)
Admission: RE | Admit: 2021-02-16 | Discharge: 2021-02-16 | Disposition: A | Discharge status: Home / Self Care | Payer: 59 | Source: Ambulatory Visit | Attending: Obstetrics and Gynecology | Admitting: Obstetrics and Gynecology

## 2021-02-16 VITALS — BP 118/62 | HR 67 | Ht 64.0 in | Wt 169.0 lb

## 2021-02-16 VITALS — BP 131/87 | HR 81 | Ht 63.0 in | Wt 168.0 lb

## 2021-02-16 DIAGNOSIS — Z01419 Encounter for gynecological examination (general) (routine) without abnormal findings: Secondary | ICD-10-CM

## 2021-02-16 DIAGNOSIS — N915 Oligomenorrhea, unspecified: Secondary | ICD-10-CM

## 2021-02-16 DIAGNOSIS — M79641 Pain in right hand: Secondary | ICD-10-CM

## 2021-02-16 DIAGNOSIS — M79642 Pain in left hand: Secondary | ICD-10-CM | POA: Diagnosis not present

## 2021-02-16 LAB — PROLACTIN: Prolactin: 2.9 ng/mL

## 2021-02-16 LAB — COMPREHENSIVE METABOLIC PANEL
AG Ratio: 1.7 (calc) (ref 1.0–2.5)
ALT: 25 U/L (ref 6–29)
AST: 22 U/L (ref 10–30)
Albumin: 4.3 g/dL (ref 3.6–5.1)
Alkaline phosphatase (APISO): 71 U/L (ref 31–125)
BUN: 10 mg/dL (ref 7–25)
CO2: 27 mmol/L (ref 20–32)
Calcium: 9 mg/dL (ref 8.6–10.2)
Chloride: 106 mmol/L (ref 98–110)
Creat: 0.73 mg/dL (ref 0.50–1.10)
Globulin: 2.5 g/dL (calc) (ref 1.9–3.7)
Glucose, Bld: 105 mg/dL — ABNORMAL HIGH (ref 65–99)
Potassium: 3.8 mmol/L (ref 3.5–5.3)
Sodium: 140 mmol/L (ref 135–146)
Total Bilirubin: 0.4 mg/dL (ref 0.2–1.2)
Total Protein: 6.8 g/dL (ref 6.1–8.1)

## 2021-02-16 LAB — CBC
HCT: 40.2 % (ref 35.0–45.0)
Hemoglobin: 14.2 g/dL (ref 11.7–15.5)
MCH: 34.4 pg — ABNORMAL HIGH (ref 27.0–33.0)
MCHC: 35.3 g/dL (ref 32.0–36.0)
MCV: 97.3 fL (ref 80.0–100.0)
MPV: 9.2 fL (ref 7.5–12.5)
Platelets: 209 10*3/uL (ref 140–400)
RBC: 4.13 10*6/uL (ref 3.80–5.10)
RDW: 11.9 % (ref 11.0–15.0)
WBC: 4 10*3/uL (ref 3.8–10.8)

## 2021-02-16 LAB — FSH/LH
FSH: 7.3 m[IU]/mL
LH: 5.5 m[IU]/mL

## 2021-02-16 LAB — LIPID PANEL
Cholesterol: 166 mg/dL (ref <200)
HDL: 67 mg/dL (ref >=50)
LDL Cholesterol (Calc): 79 mg/dL (calc)
Non-HDL Cholesterol (Calc): 99 mg/dL (calc) (ref <130)
Total CHOL/HDL Ratio: 2.5 (calc) (ref <5.0)
Triglycerides: 112 mg/dL (ref <150)

## 2021-02-16 LAB — TSH: TSH: 1.83 mIU/L

## 2021-02-16 LAB — ESTRADIOL: Estradiol: 59 pg/mL

## 2021-02-16 MED ORDER — MEDROXYPROGESTERONE ACETATE 10 MG PO TABS: 10.0000 mg | ORAL_TABLET | Freq: Every day | ORAL | 0 refills | Status: DC

## 2021-02-16 MED ORDER — MEDROXYPROGESTERONE ACETATE 10 MG PO TABS: 10.0000 mg | ORAL_TABLET | Freq: Every day | ORAL | 5 refills | Status: DC

## 2021-02-16 NOTE — Progress Notes (Signed)
[facility] Neurology Division Clinic Note - Initial Visit   Date: 02/16/21  [name] MRN: [id number] DOB: 1986/04/06   Dear Dr. Roda Jones:  Thank you for your kind referral of [name] for consultation of bilateral hand pain. Although her history is well known to you, please allow [place] to reiterate it for the purpose of our medical record. The patient was accompanied to the clinic by self.    History of Present Illness: Alicia Jones is a 35 y.o. right-handed female presenting for evaluation of bilateral hand pain.  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  A year into doing her work, she began having shooting pain at the wrists and forearm. She has achy pain in the forearm, at the base of the thumb and lateral hand.  She has constant achy pain. It is intermittent, but occurs throughout the day.  It is worse when she is working, especially by the end of the week.  Rest and NSAIDs improves her pain.  She also complains of intermittent numbness tinlging in the hands, but this is not often.  She has some relief with NSAIDs and tylenol.   She has seen Dr. Merlyn Lot and transferred to Dr. Roda Jones due to insurance coverage for these symptoms.  Prior testing has included NCS/EMG of the hands which was normal.  She has significant relief with prednisone.  No benefit with occupational therapy.   She has some relief with wrist injection for a few days. She has tried wearing a wrist brace which helps.   Out-side paper records, electronic medical record, and images have been reviewed where available and summarized as:  No results found for: HGBA1C Lab Results  Component Value Date   VITAMINB12 369 05/21/2011   Lab Results  Component Value Date   TSH 1.77 11/29/2019   Lab Results  Component Value Date   ESRSEDRATE 14 05/21/2011    Past Medical History:  Diagnosis Date  . Abdominal pain   . Abdominal pain, periumbilical   . Acute tonsillitis 09/13/2008  . ALLERGIC  RHINITIS 02/07/2008  . Chest pain   . Chest pain   . Constipation   . Diarrhea   . Easy bruising   . Generalized headaches   . MIGRAINE HEADACHE 02/03/2010  . Nausea and vomiting   . SPRAIN&STRAIN OTH SPEC SITES SHOULDER&UPPER ARM 02/03/2010  . SVD (spontaneous vaginal delivery) 05/29/2016  . Weakness   . Weight loss, unintentional     Past Surgical History:  Procedure Laterality Date  . CHOLECYSTECTOMY  07/2011  . LAPAROSCOPIC CHOLECYSTECTOMY W/ CHOLANGIOGRAPHY  07/12/11     Medications:  Outpatient Encounter Medications as of 02/16/2021  Medication Sig  . benzonatate (TESSALON) 200 MG capsule Take 1 capsule (200 mg total) by mouth 2 (two) times daily as needed for cough. (Patient not taking: Reported on 02/16/2021)  . ibuprofen (ADVIL,MOTRIN) 600 MG tablet Take 1 tablet (600 mg total) by mouth every 6 (six) hours. (Patient not taking: Reported on 02/16/2021)  . Insulin Pen Needle 32G X 6 MM MISC 1 each by Does not apply route daily. (Patient not taking: Reported on 02/16/2021)  . liraglutide (VICTOZA) 18 MG/3ML SOPN Inject 0.2 mLs (1.2 mg total) into the skin daily. (Patient not taking: Reported on 02/16/2021)  . phentermine 37.5 MG capsule Take 1 capsule (37.5 mg total) by mouth every morning. (Patient not taking: Reported on 02/16/2021)  . Prenatal Vit w/Fe-Methylfol-FA (PNV PO) Take by mouth. (Patient not taking: Reported  on 02/16/2021)   No facility-administered encounter medications on file as of 02/16/2021.    Allergies: No Known Allergies  Family History: Family History  Problem Relation Age of Onset  . Diabetes Father   . Gallbladder disease Father   . Liver disease Father   . Breast cancer Maternal Grandmother   . Cancer Maternal Grandmother        breast  . Cancer Paternal Grandmother        breast  . Cancer Paternal Grandfather        lung  . Colon cancer Neg Hx     Social History: Social History   Tobacco Use  . Smoking status: Former Smoker    Packs/day: 0.50     Types: Cigarettes    Quit date: 01/10/2018    Years since quitting: 3.1  . Smokeless tobacco: Never Used  Vaping Use  . Vaping Use: Never used  Substance Use Topics  . Alcohol use: Not Currently    Comment: social  . Drug use: No   Social History   Social History Narrative   Patient lives at home with her spouse.    Caffeine Use: consumes every other day   Lives in a two story home     Vital Signs:  BP 131/87   Pulse 81   Ht 5\' 3"  (1.6 m)   Wt 168 lb (76.2 kg)   SpO2 98%   BMI 29.76 kg/m   Neurological Exam: MENTAL STATUS including orientation to time, place, person, recent and remote memory, attention span and concentration, language, and fund of knowledge is normal.  Speech is not dysarthric.  CRANIAL NERVES: II:  No visual field defects.   III-IV-VI: Pupils equal round and reactive to light.  Normal conjugate, extra-ocular eye movements in all directions of gaze.  No nystagmus.  No ptosis.   V:  Normal facial sensation.    VII:  Normal facial symmetry and movements.   VIII:  Normal hearing and vestibular function.   IX-X:  Normal palatal movement.   XI:  Normal shoulder shrug and head rotation.   XII:  Normal tongue strength and range of motion, no deviation or fasciculation.  MOTOR:  No atrophy, fasciculations or abnormal movements.  No pronator drift.   Upper Extremity:  Right  Left  Deltoid  5/5   5/5   Biceps  5/5   5/5   Triceps  5/5   5/5   Infraspinatus 5/5  5/5  Medial pectoralis 5/5  5/5  Wrist extensors  5/5   5/5   Wrist flexors  5/5   5/5   Finger extensors  5/5   5/5   Finger flexors  5/5   5/5   Dorsal interossei  5/5   5/5   Abductor pollicis  5/5   5/5   Tone (Ashworth scale)  0  0   Lower Extremity:  Right  Left  Hip flexors  5/5   5/5   Hip extensors  5/5   5/5   Adductor 5/5  5/5  Abductor 5/5  5/5  Knee flexors  5/5   5/5   Knee extensors  5/5   5/5   Dorsiflexors  5/5   5/5   Plantarflexors  5/5   5/5   Toe extensors  5/5   5/5    Toe flexors  5/5   5/5   Tone (Ashworth scale)  0  0   MSRs:  Right  Left                  brachioradialis 2+  2+  biceps 2+  2+  triceps 2+  2+  patellar 2+  2+  ankle jerk 2+  2+  Hoffman no  no  plantar response down  down   SENSORY:  Normal and symmetric perception of light touch, pinprick, vibration, and proprioception.  Tinel's sign at the wrists is absent.   COORDINATION/GAIT: Normal finger-to- nose-finger.  Intact rapid alternating movements bilaterally.  Gait narrow based and stable. Tandem and stressed gait intact.    IMPRESSION: Bilateral wrist and forearm pain is more concerning for overuse tendonitis.  She works as Social worker and symptoms.  No clear findings to suggest carpal tunnel syndrome by NCS/EMG or exam.  Her last EMG was in November, so I suggested we can repeat it.  I will refer her to Sports Medicine for further evaluation and management of her hand pain.    Thank you for allowing me to participate in patient's care.  If I can answer any additional questions, I would be pleased to do so.    Sincerely,    Georgiana Spillane K. Allena Katz, DO

## 2021-02-16 NOTE — Patient Instructions (Addendum)
1.  Nerve testing of the hands 2.  Referral to Sports Medicine  ELECTROMYOGRAM AND NERVE CONDUCTION STUDIES (EMG/NCS) INSTRUCTIONS  How to Prepare The neurologist conducting the EMG will need to know if you have certain medical conditions. Tell the neurologist and other EMG lab personnel if you: . Have a pacemaker or any other electrical medical device . Take blood-thinning medications . Have hemophilia, a blood-clotting disorder that causes prolonged bleeding Bathing Take a shower or bath shortly before your exam in order to remove oils from your skin. Don't apply lotions or creams before the exam.  What to Expect You'll likely be asked to change into a hospital gown for the procedure and lie down on an examination table. The following explanations can help you understand what will happen during the exam.  . Electrodes. The neurologist or a technician places surface electrodes at various locations on your skin depending on where you're experiencing symptoms. Or the neurologist may insert needle electrodes at different sites depending on your symptoms.  . Sensations. The electrodes will at times transmit a tiny electrical current that you may feel as a twinge or spasm. The needle electrode may cause discomfort or pain that usually ends shortly after the needle is removed. If you are concerned about discomfort or pain, you may want to talk to the neurologist about taking a short break during the exam.  . Instructions. During the needle EMG, the neurologist will assess whether there is any spontaneous electrical activity when the muscle is at rest - activity that isn't present in healthy muscle tissue - and the degree of activity when you slightly contract the muscle.  He or she will give you instructions on resting and contracting a muscle at appropriate times. Depending on what muscles and nerves the neurologist is examining, he or she may ask you to change positions during the exam.  After your  EMG You may experience some temporary, minor bruising where the needle electrode was inserted into your muscle. This bruising should fade within several days. If it persists, contact your primary care doctor.

## 2021-02-16 NOTE — Patient Instructions (Signed)

## 2021-02-16 NOTE — Progress Notes (Signed)
35 y.o. G67P2012 Married Caucasian female here for annual exam.     Wants help with PCOS. Wants to be more healthy and lose weight.  No increaesed hair growth.   Can skip her cycle for 3 months.  When they occur, she has heavy bleeding and clots. The cycles is short and last 5 days.  She has cramping and takes Tylenol or ibuprofen and uses heat patches.  Also has some random sharp pains in her lower abdomen.   Has had blood work in the past to evaluate her PCOS.  Declines contraception.  Had mood changes in the past when she took them.   Would like one more pregnancy.  Not using prevention of pregnancy.  Used Provera and Clomid in the past and this was successful.  Last unprotected pregnancy was 2 nights ago.   Has some bilateral nipple leakage.  Stopped breast feeding 1 year and 5 months ago.  Has menstrual migraine with aura.   She has appointment for a weight loss management at a non [facility] facility.   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   PCP:  Oliver Barre, MD  Patient's last menstrual period was 12/30/2020 (approximate).     Period Pattern: (!) Irregular Menstrual Flow: Heavy (with clots) Dysmenorrhea: (!) Severe Dysmenorrhea Symptoms: Cramping,Headache,Diarrhea,Other (Comment) (low back pain)     Sexually active: Yes.    The current method of family planning is none.    Exercising: No.  The patient does not participate in regular exercise at present. Smoker:  Yes, smokes 1/2 ppd  Health Maintenance: Pap:  5-7 years ago History of abnormal Pap:  Yes, Hx of colpo but no treatment 5-7 years ago MMG:  n/a Colonoscopy:  n/a BMD:   n/a  Result  n/a TDaP: 05-01-15 Gardasil:   no HIV: Neg in preg Hep C:Unsure Screening Labs:  today   reports that she has been smoking cigarettes. She has been smoking about 0.50 packs per day. She has never used smokeless tobacco. She reports current alcohol use of about 6.0 standard drinks of alcohol per week. She reports that she  does not use drugs.  Past Medical History:  Diagnosis Date  . Abdominal pain   . Abdominal pain, periumbilical   . Abnormal Pap smear of cervix 2012   [facility], had colpo but no treatment  . Acute tonsillitis 09/13/2008  . ALLERGIC RHINITIS 02/07/2008  . Chest pain   . Chest pain   . Constipation   . Diarrhea   . Easy bruising   . Generalized headaches   . MIGRAINE HEADACHE 02/03/2010   with aura  . Nausea and vomiting   . PCOS (polycystic ovarian syndrome)   . SPRAIN&STRAIN OTH SPEC SITES SHOULDER&UPPER ARM 02/03/2010  . SVD (spontaneous vaginal delivery) 05/29/2016  . Weakness   . Weight loss, unintentional     Past Surgical History:  Procedure Laterality Date  . CHOLECYSTECTOMY  07/2011  . LAPAROSCOPIC CHOLECYSTECTOMY W/ CHOLANGIOGRAPHY  07/12/11    Current Outpatient Medications  Medication Sig Dispense Refill  . ibuprofen (ADVIL,MOTRIN) 600 MG tablet Take 1 tablet (600 mg total) by mouth every 6 (six) hours. 30 tablet 0  . Insulin Pen Needle 32G X 6 MM MISC 1 each by Does not apply route daily. 90 each 1  . Multiple Vitamin (MULTIVITAMIN) capsule Take 1 capsule by mouth daily.     No current facility-administered medications for this visit.    Family History  Problem Relation Age of Onset  .  Diabetes Father   . Gallbladder disease Father   . Liver disease Father   . Hypertension Father   . Breast cancer Maternal Grandmother   . Cancer Maternal Grandmother        breast  . Cancer Paternal Grandmother        breast  . Cancer Paternal Grandfather        lung  . Colon cancer Neg Hx     Review of Systems  All other systems reviewed and are negative.   Exam:   BP 118/62 (Cuff Size: Large)   Pulse 67   Ht 5\' 4"  (1.626 m)   Wt 169 lb (76.7 kg)   LMP 12/30/2020 (Approximate)   SpO2 (!) 87%   BMI 29.01 kg/m     General appearance: alert, cooperative and appears stated age Head: normocephalic, without obvious abnormality, atraumatic Neck: no  adenopathy, supple, symmetrical, trachea midline and thyroid normal to inspection and palpation Lungs: clear to auscultation bilaterally Breasts: normal appearance, no masses or tenderness, No nipple retraction or dimpling, No nipple discharge or bleeding, No axillary adenopathy Heart: regular rate and rhythm Abdomen: soft, non-tender; no masses, no organomegaly Extremities: extremities normal, atraumatic, no cyanosis or edema Skin: skin color, texture, turgor normal. No rashes or lesions Lymph nodes: cervical, supraclavicular, and axillary nodes normal. Neurologic: grossly normal  Pelvic: External genitalia:  no lesions              No abnormal inguinal nodes palpated.              Urethra:  normal appearing urethra with no masses, tenderness or lesions              Bartholins and Skenes: normal                 Vagina: normal appearing vagina with normal color and discharge, no lesions              Cervix: no lesions              Pap taken: Yes.   Bimanual Exam:  Uterus:  normal size, contour, position, consistency, mobility, non-tender              Adnexa: no mass, fullness, tenderness      Chaperone was present for exam.  Assessment:   Well woman visit with normal exam. PCOS.  Menorrhagia and irregular menses.  Oligomenorrhea. Dysmenorrhea.  Bilateral nipple leakage of fluid.  Smoker.  Migraine with aura. Hx abnormal pap.   Plan: Mammogram screening age 19 yo.  Self breast awareness reviewed. Pap and HR HPV as above. Guidelines for Calcium, Vitamin D, regular exercise program including cardiovascular and weight bearing exercise. Cyclic Provera 10 mg x 10 days every 36 days if no cycle and has a negative UPT first.  Ibuprofen or Aleve for cramping.  Start PNV.  Check TSH, prolactin, FSH, LH, estradiol.  Routine labs.  I offered to have her see healthy Weight Management at [facility].  She will consider this.  I offered to help with smoking cessation if desired.  Follow up  annually and prn.

## 2021-02-18 LAB — CYTOLOGY - PAP
Comment: NEGATIVE
Diagnosis: NEGATIVE
High risk HPV: NEGATIVE

## 2021-02-23 NOTE — Progress Notes (Signed)
I, Peterson Lombard, LAT, ATC acting as a scribe for Lynne Leader, MD.  Subjective:    I'm seeing this patient as a consultation for: Dr. Narda Amber. Note will be routed back to referring provider/PCP.  CC: Bilateral hand pain  HPI: Pt is a 35 y/o female c/o bilat hand pain worsening for about a year, R>L. Pt reports pain has been going on for 12+ years. Pt was previously seen by Neurology on 02/16/21 and Ortho Care on 01/27/21 w/ similar complaints. 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 Pt reports having "shooting pain" into the wrists and forearms and achy pain at the base of the thumb, and into R forearm. Pt c/o pain worsening when working, especially at the end of the week.  Patient notes that he had improvement in symptoms when she took prednisone.  Positive family history rheumatoid arthritis in mother.  Grip strength: weak UE numbness/tingling: yes- whole hand/fingers Aggravates: hair sytling Treatments tried: rest, NSAIDs, Tylenol, OT, prednisone, wrist brace, steroid injection  Dx testing: 02/16/21 NCV  03/17/05 R hand XR  Past medical history, Surgical history, Family history, Social history, Allergies, and medications have been entered into the medical record, reviewed. Positive family history rheumatoid arthritis   Review of Systems: No new headache, visual changes, nausea, vomiting, diarrhea, constipation, dizziness, abdominal pain, skin rash, fevers, chills, night sweats, weight loss, swollen lymph nodes, body aches, joint swelling, muscle aches, chest pain, shortness of breath, mood changes, visual or auditory hallucinations.   Objective:    Vitals:   02/24/21 0933  BP: 110/80  Pulse: 63  SpO2: 97%   General: Well Developed, well nourished, and in no acute distress.  Neuro/Psych: Alert and oriented x3, extra-ocular muscles intact, able to move all 4 extremities, sensation grossly intact. Skin: Warm and dry, no rashes noted.  Respiratory: Not using  accessory muscles, speaking in full sentences, trachea midline.  Cardiovascular: Pulses palpable, no extremity edema. Abdomen: Does not appear distended. MSK: C-spine: Normal-appearing Nontender normal cervical motion. Upper extremity strength is intact.  Right wrist normal-appearing nontender normal motion normal strength. Mildly positive Finkelstein's test. Minimally positive Tinel's and Phalen's test.  Left wrist normal-appearing Nontender normal strength negative Finkelstein's and Tinel's testing.  Lab and Radiology Results  Lab review from Dr. Fredna Dow shows only minimally positive CRP but negative rheumatoid factor and CCP. Patient has radiology report showing normal cervical spine and hand x-rays 2021  X-ray images chest obtained today personally and independently interpreted No acute chest process.  No cervical rib Await formal radiology review  Diagnostic Limited MSK Ultrasound of: Right wrist Normal-appearing dorsal wrist with exception of minimal tenosynovitis 6 dorsal wrist compartment of extensor carpi ulnaris tendon. Carpal tunnel normal-appearing Median nerve minimally enlarged measuring 11 mm cross-sectional area Impression: Minimal tenosynovitis wrist   Impression and Recommendations:    Assessment and Plan: 35 y.o. female with bilateral hand and wrist pain right worse than left.  Patient is already had extensive evaluation with a normal nerve conduction study, normal early rheumatologic work-up, normal x-ray imaging of the C-spine and hands and a trial of 4 sessions of hand PT.  She continues to have bothersome symptoms.  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  She has a family history of rheumatoid arthritis in her mother and if possible she has seronegative rheumatoid arthritis or just of early developing rheumatoid arthritis to explain her symptoms..  Plan for chest x-ray  to  evaluate for cervical rib to explore the possibility of thoracic outlet syndrome.  Additionally plan to broaden metabolic and rheumatologic work-up with labs listed below.  Assuming x-ray and labs come back negative we will proceed with trial of extended hand therapy as that will be the most likely thing to treat her.  Check back in 1 month.  PDMP not reviewed this encounter. Orders Placed This Encounter  Procedures  . [place] LIMITED JOINT SPACE STRUCTURES UP BILAT(NO LINKED CHARGES)    Standing Status:   Future    Number of Occurrences:   1    Standing Expiration Date:   08/27/2021    Order Specific Question:   Reason for Exam (SYMPTOM  OR DIAGNOSIS REQUIRED)    Answer:   bilateral wrist pain    Order Specific Question:   Preferred imaging location?    Answer:   [place]  . DG Chest 2 View    Standing Status:   Future    Number of Occurrences:   1    Standing Expiration Date:   02/24/2022    Order Specific Question:   Reason for Exam (SYMPTOM  OR DIAGNOSIS REQUIRED)    Answer:   eval poss thoracic outlet syndrome    Order Specific Question:   Is patient pregnant?    Answer:   No    Order Specific Question:   Preferred imaging location?    Answer:   [facility]  . B12    Standing Status:   Future    Number of Occurrences:   1    Standing Expiration Date:   02/24/2022  . VITAMIN D 25 Hydroxy (Vit-D Deficiency, Fractures)    Standing Status:   Future    Number of Occurrences:   1    Standing Expiration Date:   02/24/2022  . LUPUS(12) PANEL    Standing Status:   Future    Number of Occurrences:   1    Standing Expiration Date:   02/24/2022  . HLA-B27 antigen    Standing Status:   Future    Number of Occurrences:   1    Standing Expiration Date:   02/24/2022   No orders of the defined types were placed in this encounter.   Discussed warning signs or symptoms. Please see discharge instructions. Patient expresses understanding.   The above  documentation has been reviewed and is accurate and complete Lynne Leader, M.D.

## 2021-02-24 ENCOUNTER — Ambulatory Visit (INDEPENDENT_AMBULATORY_CARE_PROVIDER_SITE_OTHER): Payer: 59 | Admitting: Family Medicine

## 2021-02-24 ENCOUNTER — Encounter: Payer: Self-pay | Admitting: Family Medicine

## 2021-02-24 ENCOUNTER — Other Ambulatory Visit: Payer: Self-pay

## 2021-02-24 ENCOUNTER — Ambulatory Visit: Payer: Self-pay

## 2021-02-24 ENCOUNTER — Ambulatory Visit (INDEPENDENT_AMBULATORY_CARE_PROVIDER_SITE_OTHER): Payer: 59

## 2021-02-24 VITALS — BP 110/80 | HR 63 | Ht 64.0 in | Wt 170.6 lb

## 2021-02-24 DIAGNOSIS — E538 Deficiency of other specified B group vitamins: Secondary | ICD-10-CM

## 2021-02-24 DIAGNOSIS — M25531 Pain in right wrist: Secondary | ICD-10-CM | POA: Diagnosis not present

## 2021-02-24 DIAGNOSIS — M949 Disorder of cartilage, unspecified: Secondary | ICD-10-CM

## 2021-02-24 DIAGNOSIS — M899 Disorder of bone, unspecified: Secondary | ICD-10-CM

## 2021-02-24 DIAGNOSIS — E559 Vitamin D deficiency, unspecified: Secondary | ICD-10-CM | POA: Insufficient documentation

## 2021-02-24 DIAGNOSIS — M25532 Pain in left wrist: Secondary | ICD-10-CM

## 2021-02-24 LAB — VITAMIN D 25 HYDROXY (VIT D DEFICIENCY, FRACTURES): VITD: 25.95 ng/mL — ABNORMAL LOW (ref 30.00–100.00)

## 2021-02-24 LAB — VITAMIN B12: Vitamin B-12: 413 pg/mL (ref 211–911)

## 2021-02-24 NOTE — Patient Instructions (Addendum)
Thank you for coming in today.  Please get an Xray today before you leave  Please get labs today before you leave  Recheck in 4 weeks.   This may evolve over time.   Please use Voltaren gel (Generic Diclofenac Gel) up to 4x daily for pain as needed.  This is available over-the-counter as both the name brand Voltaren gel and the generic diclofenac gel.

## 2021-02-24 NOTE — Progress Notes (Signed)
B12 is normal.  However vitamin D is low.  Please start taking 5000 units of over-the-counter vitamin D daily.  This is not necessarily caused your problem but may be constipatory and increasing vitamin D may help.  Other labs are still pending

## 2021-02-25 NOTE — Progress Notes (Signed)
Chest x-ray shows no cervical ribs.  This means that thoracic outlet syndrome is much less likely.  Chest x-ray looks normal

## 2021-02-26 ENCOUNTER — Other Ambulatory Visit: Payer: Self-pay | Admitting: *Deleted

## 2021-02-26 DIAGNOSIS — R7309 Other abnormal glucose: Secondary | ICD-10-CM

## 2021-02-26 LAB — LUPUS(12) PANEL
Anti Nuclear Antibody (ANA): POSITIVE — AB
C3 Complement: 129 mg/dL (ref 83–193)
C4 Complement: 19 mg/dL (ref 15–57)
ENA SM Ab Ser-aCnc: <1 AI
Rheumatoid fact SerPl-aCnc: <14 IU/mL (ref <14)
Ribosomal P Protein Ab: <1 AI
SM/RNP: <1 AI
SSA (Ro) (ENA) Antibody, IgG: <1 AI
SSB (La) (ENA) Antibody, IgG: <1 AI
Scleroderma (Scl-70) (ENA) Antibody, IgG: <1 AI
Thyroperoxidase Ab SerPl-aCnc: 1 IU/mL (ref <9)
ds DNA Ab: <1 IU/mL

## 2021-02-26 LAB — HLA-B27 ANTIGEN: HLA-B27 Antigen: NEGATIVE

## 2021-02-26 LAB — ANTI-NUCLEAR AB-TITER (ANA TITER): ANA Titer 1: 1:80 {titer} — ABNORMAL HIGH

## 2021-02-27 NOTE — Progress Notes (Signed)
HLA-B27 the marker for psoriatic arthritis is negative.  ANA titer is positive at 1-80 however follow along subsequent labs are negative.  This probably indicates that you do not have a rheumatologic disease.  Happy to refer you to rheumatology for further evaluation however I think the next step at this point should be hand therapy.  What would you like to do?

## 2021-03-01 ENCOUNTER — Other Ambulatory Visit: Payer: Self-pay | Admitting: Obstetrics and Gynecology

## 2021-03-03 ENCOUNTER — Other Ambulatory Visit: Payer: Self-pay

## 2021-03-03 ENCOUNTER — Telehealth: Payer: Self-pay

## 2021-03-03 ENCOUNTER — Ambulatory Visit (INDEPENDENT_AMBULATORY_CARE_PROVIDER_SITE_OTHER): Payer: 59 | Admitting: Neurology

## 2021-03-03 ENCOUNTER — Other Ambulatory Visit: Payer: 59

## 2021-03-03 DIAGNOSIS — M79641 Pain in right hand: Secondary | ICD-10-CM | POA: Diagnosis not present

## 2021-03-03 DIAGNOSIS — M79642 Pain in left hand: Secondary | ICD-10-CM

## 2021-03-03 DIAGNOSIS — R7309 Other abnormal glucose: Secondary | ICD-10-CM

## 2021-03-03 NOTE — Procedures (Signed)
[facility]  [address], [address]  [place], [place] [address] Tel: [phone] Fax:  [phone] Test Date:  03/03/2021  Patient: Alicia Jones DOB: [birth year] Physician: Nita Sickle, DO  Sex: Female Height: 5\' 4"  Ref Phys: , DO  ID#: Nita Sickle   Technician:    Patient Complaints: This is a 35 year old female referred for evaluation of bilateral arm pain  NCV & EMG Findings: Extensive electrodiagnostic testing of the right upper extremity and additional studies of the left shows: 1. Bilateral median, ulnar, and mixed palmar sensory responses are within normal limits. 2. Bilateral median and ulnar motor responses are within normal limits. 3. There is no evidence of active or chronic motor axonal loss changes affecting any of the tested muscles.  Motor unit configuration and recruitment pattern is within normal limits.  Impression: This is a normal study of the upper extremities.  In particular, there is no evidence of carpal tunnel syndrome or a cervical radiculopathy.     ___________________________ 34, DO    Nerve Conduction Studies Anti Sensory Summary Table   Stim Site NR Peak (ms) Norm Peak (ms) P-T Amp (V) Norm P-T Amp  Left Median Anti Sensory (2nd Digit)  32C  Wrist    3.1 <3.4 56.2 >20  Right Median Anti Sensory (2nd Digit)  32C  Wrist    3.2 <3.4 58.5 >20  Left Ulnar Anti Sensory (5th Digit)  32C  Wrist    2.8 <3.1 43.1 >12  Right Ulnar Anti Sensory (5th Digit)  32C  Wrist    2.6 <3.1 48.0 >12   Motor Summary Table   Stim Site NR Onset (ms) Norm Onset (ms) O-P Amp (mV) Norm O-P Amp Site1 Site2 Delta-0 (ms) Dist (cm) Vel (m/s) Norm Vel (m/s)  Left Median Motor (Abd Poll Brev)  32C  Wrist    3.1 <3.9 10.5 >6 Elbow Wrist 4.6 27.0 59 >50  Elbow    7.7  10.1         Right Median Motor (Abd Poll Brev)  32C  Wrist    2.5 <3.9 10.3 >6 Elbow Wrist 4.6 28.0 61 >50  Elbow    7.1  9.8         Left Ulnar Motor (Abd Dig  Minimi)  32C  Wrist    1.9 <3.1 10.9 >7 B Elbow Wrist 3.6 22.0 61 >50  B Elbow    5.5  10.2  A Elbow B Elbow 1.8 10.0 56 >50  A Elbow    7.3  10.1         Right Ulnar Motor (Abd Dig Minimi)  32C  Wrist    2.0 <3.1 11.3 >7 B Elbow Wrist 3.4 22.0 65 >50  B Elbow    5.4  10.1  A Elbow B Elbow 1.7 10.0 59 >50  A Elbow    7.1  10.0          Comparison Summary Table   Stim Site NR Peak (ms) Norm Peak (ms) P-T Amp (V) Site1 Site2 Delta-P (ms) Norm Delta (ms)  Left Median/Ulnar Palm Comparison (Wrist - 8cm)  32C  Median Palm    1.8 <2.2 128.8 Median Palm Ulnar Palm 0.1   Ulnar Palm    1.7 <2.2 39.8      Right Median/Ulnar Palm Comparison (Wrist - 8cm)  32C  Median Palm    1.7 <2.2 130.4 Median Palm Ulnar Palm 0.1   Ulnar Palm    1.6 <2.2 36.6  EMG   Side Muscle Ins Act Fibs Psw Fasc Number Recrt Dur Dur. Amp Amp. Poly Poly. Comment  Right 1stDorInt Nml Nml Nml Nml Nml Nml Nml Nml Nml Nml Nml Nml N/A  Right PronatorTeres Nml Nml Nml Nml Nml Nml Nml Nml Nml Nml Nml Nml N/A  Right Biceps Nml Nml Nml Nml Nml Nml Nml Nml Nml Nml Nml Nml N/A  Right Triceps Nml Nml Nml Nml Nml Nml Nml Nml Nml Nml Nml Nml N/A  Right Deltoid Nml Nml Nml Nml Nml Nml Nml Nml Nml Nml Nml Nml N/A  Left 1stDorInt Nml Nml Nml Nml Nml Nml Nml Nml Nml Nml Nml Nml N/A  Left PronatorTeres Nml Nml Nml Nml Nml Nml Nml Nml Nml Nml Nml Nml N/A  Left Biceps Nml Nml Nml Nml Nml Nml Nml Nml Nml Nml Nml Nml N/A  Left Triceps Nml Nml Nml Nml Nml Nml Nml Nml Nml Nml Nml Nml N/A  Left Deltoid Nml Nml Nml Nml Nml Nml Nml Nml Nml Nml Nml Nml N/A      Waveforms:

## 2021-03-03 NOTE — Telephone Encounter (Signed)
I do not prescribe hCG for weight loss.  If she would like this treatment, she may need to see a physician weight loss clinic who has experience with this.

## 2021-03-03 NOTE — Telephone Encounter (Signed)
Spoke with patient and informed of Dr. Rica Records reply.

## 2021-03-03 NOTE — Telephone Encounter (Signed)
Patient states she is going to a weight loss clinic and they want to administer HCG injections but wants her to get approval from her physician.  She is wanting a note approving this.

## 2021-03-04 LAB — HEMOGLOBIN A1C
Hgb A1c MFr Bld: 4.7 % of total Hgb (ref <5.7)
Mean Plasma Glucose: 88 mg/dL
eAG (mmol/L): 4.9 mmol/L

## 2021-03-23 ENCOUNTER — Ambulatory Visit: Payer: 59 | Admitting: Family Medicine

## 2021-04-07 ENCOUNTER — Encounter: Payer: 59 | Admitting: Neurology

## 2021-05-13 ENCOUNTER — Ambulatory Visit: Payer: 59 | Admitting: Physician Assistant

## 2021-06-03 ENCOUNTER — Telehealth: Payer: Self-pay | Admitting: Internal Medicine

## 2021-06-03 ENCOUNTER — Encounter: Payer: Self-pay | Admitting: Physician Assistant

## 2021-06-03 NOTE — Telephone Encounter (Signed)
error 

## 2021-07-07 ENCOUNTER — Ambulatory Visit: Payer: 59 | Admitting: Physician Assistant

## 2021-08-06 ENCOUNTER — Ambulatory Visit (INDEPENDENT_AMBULATORY_CARE_PROVIDER_SITE_OTHER): Payer: 59 | Admitting: Physician Assistant

## 2021-08-06 ENCOUNTER — Encounter: Payer: Self-pay | Admitting: Physician Assistant

## 2021-08-06 VITALS — BP 120/82 | HR 84 | Ht 64.0 in | Wt 166.5 lb

## 2021-08-06 DIAGNOSIS — K644 Residual hemorrhoidal skin tags: Secondary | ICD-10-CM | POA: Diagnosis not present

## 2021-08-06 DIAGNOSIS — K640 First degree hemorrhoids: Secondary | ICD-10-CM | POA: Diagnosis not present

## 2021-08-06 MED ORDER — HYDROCORTISONE (PERIANAL) 2.5 % EX CREA: 1.0000 "application " | TOPICAL_CREAM | Freq: Two times a day (BID) | CUTANEOUS | 1 refills | Status: AC

## 2021-08-06 NOTE — Addendum Note (Signed)
Addended by: Vicente Serene on: 08/06/2021 09:54 AM   Modules accepted: Orders

## 2021-08-06 NOTE — Patient Instructions (Signed)
If you are age 33 or younger, your body mass index should be between 19-25. Your Body mass index is 28.58 kg/m. If this is out of the aformentioned range listed, please consider follow up with your Primary Care Provider.   The  GI providers would like to encourage you to use [facility] to communicate with providers for non-urgent requests or questions.  Due to long hold times on the telephone, sending your provider a message by [facility] may be faster and more efficient way to get a response. Please allow 48 business hours for a response.  Please remember that this is for non-urgent requests/questions.  OVER THE COUNTER MEDICATION Please purchase the following medications over the counter and take as directed:  Hydrocortisone ointment Preparation H suppositories.  Place the ointment on the outside of the suppository and use twice a day for 2 weeks.  Follow up as needed. It was great seeing you today! Thank you for entrusting me with your care and choosing [facility].  Hyacinth Meeker, [place]

## 2021-08-06 NOTE — Progress Notes (Signed)
Chief Complaint: Hemorrhoids  HPI:    Alicia Jones is a 35 year old female with past medical history as listed below, known to Dr. Leone Payor, who was referred to me by [name], MD for a complaint of hemorrhoids.    05/2011 EGD normal.  Biopsies negative for celiac disease.    04/25/2014 patient presented for a colonoscopy which she never had done due to insurance cost.  She was started on Colestid 5 g by mouth daily for diarrhea.    Today, the patient presents to clinic and tells me that she has had an increasing problem with hemorrhoids over the past 6 months or so.  She will often have pain/tenderness to the point where she has to lay on her side or not sit on her bottom, she will occasionally see some bright red blood in the toilet paper when wiping when they are irritated and has itching and discomfort.  Tells me that she will have weeks that are better than others.  She has used over-the-counter Preparation H daily and also a "cream that was prescribed by the telemedicine doctor", for about a month.  Currently they are not bothering her a lot.  Tells me she still varies from diarrhea to constipation, but the diarrhea is much less of a problem than it was before, now only related to certain food she eats.    Denies fever, chills, weight loss, abdominal pain, nausea or vomiting.  Past Medical History:  Diagnosis Date   Abdominal pain    Abdominal pain, periumbilical    Abnormal Pap smear of cervix 2012   [facility], had colpo but no treatment   Acute tonsillitis 09/13/2008   ALLERGIC RHINITIS 02/07/2008   Chest pain    Chest pain    Constipation    Diarrhea    Easy bruising    Generalized headaches    MIGRAINE HEADACHE 02/03/2010   with aura   Nausea and vomiting    PCOS (polycystic ovarian syndrome)    SPRAIN&STRAIN OTH SPEC SITES SHOULDER&UPPER ARM 02/03/2010   SVD (spontaneous vaginal delivery) 05/29/2016   Weakness    Weight loss, unintentional     Past Surgical History:   Procedure Laterality Date   CHOLECYSTECTOMY  07/2011   LAPAROSCOPIC CHOLECYSTECTOMY W/ CHOLANGIOGRAPHY  07/12/11    Current Outpatient Medications  Medication Sig Dispense Refill   Multiple Vitamin (MULTIVITAMIN) capsule Take 1 capsule by mouth daily.     No current facility-administered medications for this visit.    Allergies as of 08/06/2021   (No Known Allergies)    Family History  Problem Relation Age of Onset   Diabetes Father    Gallbladder disease Father    Liver disease Father    Hypertension Father    Rheum arthritis Father    Breast cancer Maternal Grandmother    Cancer Maternal Grandmother        breast   Cancer Paternal Grandmother        breast   Cancer Paternal Grandfather        lung   Colon cancer Neg Hx     Social History   Socioeconomic History   Marital status: Married    Spouse name: josh   Number of children: 2   Years of education: College   Highest education level: Not on file  Occupational History   Occupation: hairdresser and Conservation officer, [place]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    Caffeine Use: consumes every other day   Lives in a two story home    Social Determinants of Health   Financial Resource Strain: Not on file  Food Insecurity: Not on file  Transportation Needs: Not on file  Physical Activity: Not on file  Stress: Not on file  Social Connections: Not on file  Intimate Partner  Violence: Not on file    Review of Systems:    Constitutional: No weight loss, fever or chills Skin: No rash  Cardiovascular: No chest pain Respiratory: No SOB Gastrointestinal: See HPI and otherwise negative Genitourinary: No dysuria Neurological: No headache, dizziness or syncope Musculoskeletal: No new muscle or joint pain Hematologic: No bruising Psychiatric: No history of depression or anxiety   Physical Exam:  Vital signs: BP 120/82   Pulse 84   Ht 5\' 4"  (1.626 m)   Wt 166 lb 8 oz (75.5 kg)   SpO2 99%   BMI 28.58 kg/m    Constitutional:   Pleasant Caucasian female appears to be in NAD, Well developed, Well nourished, alert and cooperative Head:  Normocephalic and atraumatic. Eyes:   PEERL, EOMI. No icterus. Conjunctiva pink. Ears:  Normal auditory acuity. Neck:  Supple Throat: Oral cavity and pharynx without inflammation, swelling or lesion.  Respiratory: Respirations even and unlabored. Lungs clear to auscultation bilaterally.   No wheezes, crackles, or rhonchi.  Cardiovascular: Normal S1, S2. No MRG. Regular rate and rhythm. No peripheral edema, cyanosis or pallor.  Gastrointestinal:  Soft, nondistended, nontender. No rebound or guarding. Normal bowel sounds. No appreciable masses or hepatomegaly. Rectal: External: 2 external hemorrhoids, noninflamed, nonthrombosed, nontender; internal: No mass, no residue; anoscopy: Grade 1 hemorrhoids Msk:  Symmetrical without gross deformities. Without edema, no deformity or joint abnormality.  Neurologic:  Alert and  oriented x4;  grossly normal neurologically.  Skin:   Dry and intact without significant lesions or rashes. Psychiatric:  Demonstrates good judgement and reason without abnormal affect or behaviors.  RELEVANT LABS AND IMAGING: CBC    Component Value Date/Time   WBC 4.0 02/16/2021 1141   RBC 4.13 02/16/2021 1141   HGB 14.2 02/16/2021 1141   HCT 40.2 02/16/2021 1141   PLT 209 02/16/2021 1141   MCV 97.3  02/16/2021 1141   MCH 34.4 (H) 02/16/2021 1141   MCHC 35.3 02/16/2021 1141   RDW 11.9 02/16/2021 1141   LYMPHSABS 1.3 11/29/2019 1029   MONOABS 0.3 11/29/2019 1029   EOSABS 0.1 11/29/2019 1029   BASOSABS 0.0 11/29/2019 1029    CMP     Component Value Date/Time   NA 140 02/16/2021 1141   K 3.8 02/16/2021 1141   CL 106 02/16/2021 1141   CO2 27 02/16/2021 1141   GLUCOSE 105 (H) 02/16/2021 1141   BUN 10 02/16/2021 1141   CREATININE 0.73 02/16/2021 1141   CALCIUM 9.0 02/16/2021 1141   PROT 6.8 02/16/2021 1141   ALBUMIN  4.5 11/29/2019 1029   AST 22 02/16/2021 1141   ALT 25 02/16/2021 1141   ALKPHOS 82 11/29/2019 1029   BILITOT 0.4 02/16/2021 1141   GFRNONAA 113 02/07/2008 0000   GFRAA 137 02/07/2008 0000    Assessment: 1.  Grade 1 internal hemorrhoids: Currently only mildly inflamed 2.  External hemorrhoids: Likely these are what thrombosed and give her pain at different times  Plan: 1.  Discussed with patient that today hemorrhoids are not too irritated.  Would recommend that she use Hydrocortisone ointment twice daily for the next 2 weeks.  She should apply this in a small amount to the outside of her rectum as well as to a Preparation H suppository inserted twice a day. 2.  Hopefully above will help her with irritation.  If it does not or she has increased pain she can call our clinic and get in to a stat appointment slot to be seen sooner.  It is hard for me to tell whether her internal hemorrhoids or her external hemorrhoids are more of a problem for her.  It would be helpful to see her in acute pain phase. 3.  Discussed that we can treat internal hemorrhoids here, but she would need referral to [facility] for surgical treatment of external hemorrhoids. 4.  Patient to follow in clinic as needed.  Hyacinth Meeker, PA-C [facility] 08/06/2021, 9:15 AM  Cc: [name], MD

## 2021-08-18 NOTE — Progress Notes (Deleted)
GYNECOLOGY  VISIT   HPI: 35 y.o.   Married  Caucasian  female   [phone] with No LMP recorded.   here for   hemorrhoids  GYNECOLOGIC HISTORY: No LMP recorded. 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   Right carpal tunnel syndrome 11/29/2019   Indication for care in labor or delivery 11/03/2018   Cough 06/22/2017   Dyspnea 06/22/2017   Chest pain 06/22/2017   Vaginal yeast infection 06/22/2017   Hematochezia 03/26/2017   Diarrhea 03/23/2017   Obesity 03/23/2017   SVD (spontaneous vaginal delivery) 05/29/2016   Long term current use of non-steroidal anti-inflammatories (NSAID) 05/21/2011   Preventative health care 05/20/2011   Migraine headache 02/03/2010   Allergic rhinitis 02/07/2008    Past Medical History:  Diagnosis Date   Abdominal pain    Abdominal pain, periumbilical    Abnormal Pap smear of cervix 2012   [facility]OB/GYN, had colpo but no treatment   Acute tonsillitis 09/13/2008   ALLERGIC RHINITIS 02/07/2008   Chest pain    Chest pain    Constipation    Diarrhea    Easy bruising    Generalized headaches    MIGRAINE HEADACHE 02/03/2010   with aura   Nausea and vomiting    PCOS (polycystic ovarian syndrome)    SPRAIN&STRAIN OTH SPEC SITES SHOULDER&UPPER ARM 02/03/2010   SVD (spontaneous vaginal delivery) 05/29/2016   Weakness    Weight loss, unintentional     Past Surgical History:  Procedure Laterality Date   CHOLECYSTECTOMY  07/2011   LAPAROSCOPIC CHOLECYSTECTOMY W/ CHOLANGIOGRAPHY  07/12/11    Current Outpatient Medications  Medication Sig Dispense  Refill   hydrocortisone (ANUSOL-HC) 2.5 % rectal cream Place 1 application rectally 2 (two) times daily. 30 g 1   Multiple Vitamin (MULTIVITAMIN) capsule Take 1 capsule by mouth daily.     No current facility-administered medications for this visit.     ALLERGIES: Patient has no known allergies.  Family History  Problem Relation Age of Onset   Diabetes Father    Gallbladder disease Father    Liver disease Father    Hypertension Father    Rheum arthritis Father    Breast cancer Maternal Grandmother    Cancer Maternal Grandmother        breast   Cancer Paternal Grandmother        breast   Cancer Paternal Grandfather        lung   Colon cancer Neg Hx     Social History   Socioeconomic History   Marital status: Married    Spouse name: josh   Number of children: 2   Years of education: College   Highest education level: Not on file  Occupational History   Occupation: hairdresser and Conservation officer, [employer] 50h/week  Occupation: Engineer, maintenance (IT): [employer]  Tobacco Use   Smoking status: Every Day    Packs/day: 0.50    Types: Cigarettes    Last attempt to quit: 01/10/2018    Years since quitting: 3.6   Smokeless tobacco: Never  Vaping Use   Vaping Use: Never used  Substance and Sexual Activity   Alcohol use: Yes    Alcohol/week: 6.0 standard drinks    Types: 6 Glasses of wine per week   Drug use: No   Sexual activity: Yes    Birth control/protection: None  Other Topics Concern   Not on file  Social History Narrative   Patient lives at home with her spouse.    Caffeine Use: consumes every other day   Lives in a two story home    Social Determinants of Health   Financial Resource Strain: Not on file  Food Insecurity: Not on file  Transportation Needs: Not on file  Physical Activity: Not on file  Stress: Not on file  Social Connections: Not on file  Intimate Partner Violence: Not on file    Review of Systems  PHYSICAL EXAMINATION:    There were no  vitals taken for this visit.    General appearance: alert, cooperative and appears stated age Head: Normocephalic, without obvious abnormality, atraumatic Neck: no adenopathy, supple, symmetrical, trachea midline and thyroid normal to inspection and palpation Lungs: clear to auscultation bilaterally Breasts: normal appearance, no masses or tenderness, No nipple retraction or dimpling, No nipple discharge or bleeding, No axillary or supraclavicular adenopathy Heart: regular rate and rhythm Abdomen: soft, non-tender, no masses,  no organomegaly Extremities: extremities normal, atraumatic, no cyanosis or edema Skin: Skin color, texture, turgor normal. No rashes or lesions Lymph nodes: Cervical, supraclavicular, and axillary nodes normal. No abnormal inguinal nodes palpated Neurologic: Grossly normal  Pelvic: External genitalia:  no lesions              Urethra:  normal appearing urethra with no masses, tenderness or lesions              Bartholins and Skenes: normal                 Vagina: normal appearing vagina with normal color and discharge, no lesions              Cervix: no lesions                Bimanual Exam:  Uterus:  normal size, contour, position, consistency, mobility, non-tender              Adnexa: no mass, fullness, tenderness              Rectal exam: {yes no:[id number]}.  Confirms.              Anus:  normal sphincter tone, no lesions  Chaperone was present for exam:  ***  ASSESSMENT     PLAN     An After Visit Summary was printed and given to the patient.  ______ minutes face to face time of which over 50% was spent in counseling.

## 2021-08-19 ENCOUNTER — Ambulatory Visit: Payer: 59 | Admitting: Obstetrics and Gynecology

## 2021-11-24 ENCOUNTER — Encounter: Payer: Self-pay | Admitting: Physician Assistant

## 2021-11-24 ENCOUNTER — Ambulatory Visit (INDEPENDENT_AMBULATORY_CARE_PROVIDER_SITE_OTHER): Payer: Managed Care, Other (non HMO) | Admitting: Physician Assistant

## 2021-11-24 ENCOUNTER — Other Ambulatory Visit: Payer: Self-pay

## 2021-11-24 DIAGNOSIS — L859 Epidermal thickening, unspecified: Secondary | ICD-10-CM

## 2021-11-24 DIAGNOSIS — L819 Disorder of pigmentation, unspecified: Secondary | ICD-10-CM

## 2021-11-24 MED ORDER — BETAMETHASONE DIPROPIONATE AUG 0.05 % EX CREA: TOPICAL_CREAM | Freq: Two times a day (BID) | CUTANEOUS | 4 refills | Status: AC

## 2021-12-13 ENCOUNTER — Encounter: Payer: Self-pay | Admitting: Physician Assistant

## 2021-12-13 NOTE — Progress Notes (Signed)
° °  New Patient   Subjective  Alicia Jones is a 36 y.o. female who presents for the following: Skin Problem (Dark patches on knees & ankles x 1 year-  no itch no bleed- tx otc hydrocortisone cream & another cream cant remember name [facility]) ).   The following portions of the chart were reviewed this encounter and updated as appropriate:  Tobacco   Allergies   Meds   Problems   Med Hx   Surg Hx   Fam Hx       Objective  Well appearing patient in no apparent distress; mood and affect are within normal limits.  A full examination was performed including scalp, head, eyes, ears, nose, lips, neck, chest, axillae, abdomen, back, buttocks, bilateral upper extremities, bilateral lower extremities, hands, feet, fingers, toes, fingernails, and toenails. All findings within normal limits unless otherwise noted below.  Left Knee - Anterior, Right Knee - Anterior Hyperkeratois and hyperpigmentation of both knees.         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   augmented betamethasone dipropionate (DIPROLENE-AF) 0.05 % cream - Left Knee - Anterior, Right Knee - Anterior Apply topically 2 (two) times daily.     I, Uzziah Rigg, PA-C, have reviewed all documentation's for this visit.  The documentation on 12/13/21 for the exam, diagnosis, procedures and orders are all accurate and complete.

## 2021-12-22 ENCOUNTER — Other Ambulatory Visit: Payer: Self-pay

## 2021-12-22 ENCOUNTER — Encounter: Payer: Self-pay | Admitting: Internal Medicine

## 2021-12-22 ENCOUNTER — Ambulatory Visit (INDEPENDENT_AMBULATORY_CARE_PROVIDER_SITE_OTHER): Payer: Managed Care, Other (non HMO) | Admitting: Internal Medicine

## 2021-12-22 VITALS — BP 112/60 | HR 89 | Temp 98.3°F | Ht 64.0 in | Wt 165.0 lb

## 2021-12-22 DIAGNOSIS — E538 Deficiency of other specified B group vitamins: Secondary | ICD-10-CM

## 2021-12-22 DIAGNOSIS — F419 Anxiety disorder, unspecified: Secondary | ICD-10-CM

## 2021-12-22 DIAGNOSIS — E669 Obesity, unspecified: Secondary | ICD-10-CM

## 2021-12-22 DIAGNOSIS — Z0001 Encounter for general adult medical examination with abnormal findings: Secondary | ICD-10-CM | POA: Diagnosis not present

## 2021-12-22 DIAGNOSIS — Z1159 Encounter for screening for other viral diseases: Secondary | ICD-10-CM

## 2021-12-22 DIAGNOSIS — E559 Vitamin D deficiency, unspecified: Secondary | ICD-10-CM | POA: Diagnosis not present

## 2021-12-22 DIAGNOSIS — H6983 Other specified disorders of Eustachian tube, bilateral: Secondary | ICD-10-CM

## 2021-12-22 DIAGNOSIS — G5603 Carpal tunnel syndrome, bilateral upper limbs: Secondary | ICD-10-CM

## 2021-12-22 DIAGNOSIS — E7800 Pure hypercholesterolemia, unspecified: Secondary | ICD-10-CM

## 2021-12-22 DIAGNOSIS — K589 Irritable bowel syndrome without diarrhea: Secondary | ICD-10-CM | POA: Diagnosis not present

## 2021-12-22 DIAGNOSIS — J309 Allergic rhinitis, unspecified: Secondary | ICD-10-CM

## 2021-12-22 DIAGNOSIS — R739 Hyperglycemia, unspecified: Secondary | ICD-10-CM | POA: Diagnosis not present

## 2021-12-22 LAB — CBC WITH DIFFERENTIAL/PLATELET
Basophils Absolute: 0 10*3/uL (ref 0.0–0.1)
Basophils Relative: 0.5 % (ref 0.0–3.0)
Eosinophils Absolute: 0.1 10*3/uL (ref 0.0–0.7)
Eosinophils Relative: 1.5 % (ref 0.0–5.0)
HCT: 39.9 % (ref 36.0–46.0)
Hemoglobin: 14.3 g/dL (ref 12.0–15.0)
Lymphocytes Relative: 19.3 % (ref 12.0–46.0)
Lymphs Abs: 1 10*3/uL (ref 0.7–4.0)
MCHC: 35.9 g/dL (ref 30.0–36.0)
MCV: 96.4 fl (ref 78.0–100.0)
Monocytes Absolute: 0.4 10*3/uL (ref 0.1–1.0)
Monocytes Relative: 7.9 % (ref 3.0–12.0)
Neutro Abs: 3.6 10*3/uL (ref 1.4–7.7)
Neutrophils Relative %: 70.8 % (ref 43.0–77.0)
Platelets: 219 10*3/uL (ref 150.0–400.0)
RBC: 4.13 Mil/uL (ref 3.87–5.11)
RDW: 12.2 % (ref 11.5–15.5)
WBC: 5.1 10*3/uL (ref 4.0–10.5)

## 2021-12-22 LAB — URINALYSIS, ROUTINE W REFLEX MICROSCOPIC
Bilirubin Urine: NEGATIVE
Hgb urine dipstick: NEGATIVE
Ketones, ur: NEGATIVE
Leukocytes,Ua: NEGATIVE
Nitrite: NEGATIVE
Specific Gravity, Urine: 1.02 (ref 1.000–1.030)
Total Protein, Urine: NEGATIVE
Urine Glucose: NEGATIVE
Urobilinogen, UA: 0.2 (ref 0.0–1.0)
pH: 6.5 (ref 5.0–8.0)

## 2021-12-22 LAB — BASIC METABOLIC PANEL
BUN: 14 mg/dL (ref 6–23)
CO2: 28 mEq/L (ref 19–32)
Calcium: 9.2 mg/dL (ref 8.4–10.5)
Chloride: 102 mEq/L (ref 96–112)
Creatinine, Ser: 0.72 mg/dL (ref 0.40–1.20)
GFR: 109.27 mL/min (ref >60.00)
Glucose, Bld: 80 mg/dL (ref 70–99)
Potassium: 4.3 mEq/L (ref 3.5–5.1)
Sodium: 137 mEq/L (ref 135–145)

## 2021-12-22 LAB — LIPID PANEL
Cholesterol: 158 mg/dL (ref 0–200)
HDL: 66.9 mg/dL (ref >39.00)
LDL Cholesterol: 73 mg/dL (ref 0–99)
NonHDL: 90.81
Total CHOL/HDL Ratio: 2
Triglycerides: 89 mg/dL (ref 0.0–149.0)
VLDL: 17.8 mg/dL (ref 0.0–40.0)

## 2021-12-22 LAB — HEPATIC FUNCTION PANEL
ALT: 23 U/L (ref 0–35)
AST: 18 U/L (ref 0–37)
Albumin: 4.4 g/dL (ref 3.5–5.2)
Alkaline Phosphatase: 65 U/L (ref 39–117)
Bilirubin, Direct: 0.1 mg/dL (ref 0.0–0.3)
Total Bilirubin: 0.5 mg/dL (ref 0.2–1.2)
Total Protein: 7 g/dL (ref 6.0–8.3)

## 2021-12-22 LAB — VITAMIN D 25 HYDROXY (VIT D DEFICIENCY, FRACTURES): VITD: 37.35 ng/mL (ref 30.00–100.00)

## 2021-12-22 LAB — VITAMIN B12: Vitamin B-12: 470 pg/mL (ref 211–911)

## 2021-12-22 LAB — TSH: TSH: 1.51 u[IU]/mL (ref 0.35–5.50)

## 2021-12-22 LAB — HEMOGLOBIN A1C: Hgb A1c MFr Bld: 4.7 % (ref 4.6–6.5)

## 2021-12-22 MED ORDER — PHENTERMINE HCL 37.5 MG PO CAPS: 37.5000 mg | ORAL_CAPSULE | ORAL | 0 refills | Status: AC

## 2021-12-22 MED ORDER — CITALOPRAM HYDROBROMIDE 20 MG PO TABS: 20.0000 mg | ORAL_TABLET | Freq: Every day | ORAL | 3 refills | Status: AC

## 2021-12-22 MED ORDER — LINACLOTIDE 72 MCG PO CAPS: 72.0000 ug | ORAL_CAPSULE | Freq: Every day | ORAL | 1 refills | Status: AC

## 2021-12-22 NOTE — Progress Notes (Signed)
Patient ID: Alicia Jones, female   DOB: 09/16/1986, 36 y.o.   MRN: [id number] ? ? ? ?     Chief Complaint:: wellness exam and Office Visit (Discuss IBS; per patient has either extreme constipation or diarrhea/Patient c/o having bilateral ear pain/Discuss anxiety medicine) ?  ?     HPI:  Alicia Jones is a 36 y.o. female here for wellness exam; declines covid booster, flu shot o/w up to date ?         ?              Also has ongoing IBS symptoms with alternating diarrhea/constipation but Denies worsening reflux, abd pain, dysphagia, n/v or blood.  Now much life impairing.  Worst part is the constipation which seems predominant.  Also has bilateral ear fullness and muffled at times without HA, fever, worsening pain, dizziness, vertigo, or tinnitus.  Does have several wks ongoing nasal allergy symptoms with clearish congestion, itch and sneezing, without fever, pain, ST, cough, swelling or wheezing.  Denies worsening depressive symptoms, suicidal ideation, or panic; has ongoing anxiety, worsening recent as well.  Also wanting to try to lose wt, knowing phentermine is likely temporary fix.  Also incidentally has right > left mild CTs symptoms with numbness.   ?  ?Wt Readings from Last 3 Encounters:  ?12/22/21 165 lb (74.8 kg)  ?08/06/21 166 lb 8 oz (75.5 kg)  ?02/24/21 170 lb 9.6 oz (77.4 kg)  ? ?BP Readings from Last 3 Encounters:  ?12/22/21 112/60  ?08/06/21 120/82  ?02/24/21 110/80  ? ?Immunization History  ?Administered Date(s) Administered  ? Influenza Split 11/21/2015, 12/03/2016, 07/07/2018, 07/31/2019  ? PFIZER(Purple Top)SARS-COV-2 Vaccination 01/05/2020, 02/11/2020  ? Tdap 05/01/2015, 03/11/2016  ? ?There are no preventive care reminders to display for this patient. ? ?  ? ?Past Medical History:  ?Diagnosis Date  ? Abdominal pain   ? Abdominal pain, periumbilical   ? Abnormal Pap smear of cervix 2012  ? [facility], had colpo but no treatment  ? Acute tonsillitis 09/13/2008  ? ALLERGIC RHINITIS 02/07/2008  ?  Chest pain   ? Chest pain   ? Constipation   ? Diarrhea   ? Easy bruising   ? Generalized headaches   ? 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 Nausea and vomiting   ? PCOS (polycystic ovarian syndrome)   ? SPRAIN&STRAIN OTH SPEC SITES SHOULDER&UPPER ARM 02/03/2010  ? SVD (spontaneous vaginal delivery) 05/29/2016  ? Weakness   ? Weight loss, unintentional   ? ?Past Surgical History:  ?Procedure Laterality Date  ? CHOLECYSTECTOMY  07/2011  ? LAPAROSCOPIC CHOLECYSTECTOMY W/ CHOLANGIOGRAPHY  07/12/11  ? ? reports that she has been smoking cigarettes. She has been smoking an average of .5 packs per day. She has never used smokeless tobacco. She reports current alcohol use of about 6.0 standard drinks per week. She reports that she does not use drugs. ?family history includes Breast cancer in her maternal grandmother; Cancer in her maternal grandmother, paternal grandfather, and paternal grandmother; Diabetes in her father; Gallbladder disease in her father; Hypertension in her father; Liver disease in her father; Rheum arthritis in her father. ?No Known Allergies ?Current Outpatient Medications on File Prior to Visit  ?Medication Sig Dispense Refill  ? Multiple Vitamin (MULTIVITAMIN) capsule Take 1 capsule by mouth daily.    ? hydrocortisone (ANUSOL-HC) 2.5 % rectal cream Place 1 application rectally 2 (two) times daily. (Patient not taking: Reported on 12/22/2021) 30 g 1  ? ?No  current facility-administered medications on file prior to visit.  ? ?     ROS:  All others reviewed and negative. ? ?Objective  ? ?     PE:  BP 112/60 (BP Location: Right Arm, Patient Position: Sitting, Cuff Size: Large)   Pulse 89   Temp 98.3 ?F (36.8 ?C) (Oral)   Ht 5\' 4"  (1.626 m)   Wt 165 lb (74.8 kg)   SpO2 98%   BMI 28.32 kg/m?  ? ?              Constitutional: Pt appears in NAD ?              HENT: Head: NCAT.  ?              Right Ear: External ear normal.   ?              Left Ear: External ear normal. Bilat tm's with mild  erythema.  Max sinus areas non tender.  Pharynx with mild erythema, no exudate ?              Eyes: . Pupils are equal, round, and reactive to light. Conjunctivae and EOM are normal ?              Nose: without d/c or deformity ?              Neck: Neck supple. Gross normal ROM ?              Cardiovascular: Normal rate and regular rhythm.   ?              Pulmonary/Chest: Effort normal and breath sounds without rales or wheezing.  ?              Abd:  Soft, NT, ND, + BS, no organomegaly ?              Neurological: Pt is alert. At baseline orientation, motor grossly intact ?              Skin: Skin is warm. No rashes, no other new lesions, LE edema - none ? ? ?              Psychiatric: Pt behavior is normal without agitation  ? ?Micro: none ? ?Cardiac tracings I have personally interpreted today:  none ? ?Pertinent Radiological findings (summarize): none  ? ?Lab Results  ?Component Value Date  ? WBC 5.1 12/22/2021  ? HGB 14.3 12/22/2021  ? HCT 39.9 12/22/2021  ? PLT 219.0 12/22/2021  ? GLUCOSE 80 12/22/2021  ? CHOL 158 12/22/2021  ? TRIG 89.0 12/22/2021  ? HDL 66.90 12/22/2021  ? LDLCALC 73 12/22/2021  ? ALT 23 12/22/2021  ? AST 18 12/22/2021  ? NA 137 12/22/2021  ? K 4.3 12/22/2021  ? CL 102 12/22/2021  ? CREATININE 0.72 12/22/2021  ? BUN 14 12/22/2021  ? CO2 28 12/22/2021  ? TSH 1.51 12/22/2021  ? INR 0.9 04/02/2014  ? HGBA1C 4.7 12/22/2021  ? ?Assessment/Plan:  ?Alicia Jones is a 36 y.o. White or Caucasian [1] female with  has a past medical history of Abdominal pain, Abdominal pain, periumbilical, Abnormal Pap smear of cervix (2012), Acute tonsillitis (09/13/2008), ALLERGIC RHINITIS (02/07/2008), Chest pain, Chest pain, Constipation, Diarrhea, Easy bruising, Generalized headaches, MIGRAINE HEADACHE (02/03/2010), Nausea and vomiting, PCOS (polycystic ovarian syndrome), SPRAIN&STRAIN OTH SPEC SITES SHOULDER&UPPER ARM (02/03/2010), SVD (spontaneous vaginal delivery) (05/29/2016), Weakness, and Weight loss,  unintentional. ? ?Vitamin  D deficiency ?Last vitamin D ?Lab Results  ?Component Value Date  ? VD25OH 25.95 (L) 02/24/2021  ?low, to start oral replacement ? ? ?Bilateral carpal tunnel syndrome ?Right > left, mild, for ibuproen and wrist splint qhs/ ? ?Encounter for well adult exam with abnormal findings ?Age and sex appropriate education and counseling updated with regular exercise and diet ?Referrals for preventative services - none needed ?Immunizations addressed - declines covid booster, flu shot ?Smoking counseling  - counsled to quit, pt not ready ?Evidence for depression or other mood disorder - none significant ?Most recent labs reviewed. ?I have personally reviewed and have noted: ?1) the patient's medical and social history ?2) The patient's current medications and supplements ?3) The patient's height, weight, and BMI have been recorded in the chart ? ? ?IBS (irritable bowel syndrome) ?Ok for linzess prn, delcines GI referral for now ? ?Anxiety ?Mild to mod worsening subjective recent, for celexa 20 qd ? ?Allergic rhinitis ?Mild to mod, for otc nasacort prn,  to f/u any worsening symptoms or concerns ? ?Obesity ?Ok for phentermine 37 mg qd x 3 mo, also refer to medical wt management ? ?Acute dysfunction of Eustachian tube, bilateral ?D/w pt, mild to mod, for mucinex bid prn ? ?Followup: Return in about 1 year (around 12/23/2022). ? ?Oliver Barre, MD 12/27/2021 10:04 PM ?[facility] ?[facility] ?Internal Medicine ?

## 2021-12-22 NOTE — Assessment & Plan Note (Signed)
Last vitamin D ?Lab Results  ?Component Value Date  ? VD25OH 25.95 (L) 02/24/2021  ?low, to start oral replacement ? ?

## 2021-12-22 NOTE — Patient Instructions (Addendum)
Please take all new medication as prescribed - the celexa for nerves, and linzess for IBS as needed ? ?Please also take OTC Allegra and Nasacort and even Mucinex to control the sinus allergies and help the ears drain ? ?Please take all new medication as prescribed - the phentermine for weight ? ?You will be contacted regarding the referral for: weight management ? ?Please call if you would want or need the GI referral ? ?Please continue all other medications as before, and refills have been done if requested. ? ?Please have the pharmacy call with any other refills you may need. ? ?Please continue your efforts at being more active, low cholesterol diet, and weight control. ? ?You are otherwise up to date with prevention measures today. ? ?Please keep your appointments with your specialists as you may have planned ? ?Please go to the LAB at the blood drawing area for the tests to be done ? ?You will be contacted by phone if any changes need to be made immediately.  Otherwise, you will receive a letter about your results with an explanation, but please check with MyChart first. ? ?Please remember to sign up for MyChart if you have not done so, as this will be important to you in the future with finding out test results, communicating by private email, and scheduling acute appointments online when needed. ? ?Please make an Appointment to return for your 1 year visit, or as needed ? ? ? ? ? ?

## 2021-12-22 NOTE — Assessment & Plan Note (Signed)
Right > left, mild, for ibuproen and wrist splint qhs/ ?

## 2021-12-23 LAB — HEPATITIS C ANTIBODY
Hepatitis C Ab: NONREACTIVE
SIGNAL TO CUT-OFF: 0.03 (ref <1.00)

## 2021-12-27 ENCOUNTER — Encounter: Payer: Self-pay | Admitting: Internal Medicine

## 2021-12-27 DIAGNOSIS — H6983 Other specified disorders of Eustachian tube, bilateral: Secondary | ICD-10-CM | POA: Insufficient documentation

## 2021-12-27 NOTE — Assessment & Plan Note (Signed)
Mild to mod, for otc nasacort prn,  to f/u any worsening symptoms or concerns 

## 2021-12-27 NOTE — Assessment & Plan Note (Signed)
Ok for phentermine 37 mg qd x 3 mo, also refer to medical wt management ?

## 2021-12-27 NOTE — Assessment & Plan Note (Signed)
Mild to mod worsening subjective recent, for celexa 20 qd ?

## 2021-12-27 NOTE — Assessment & Plan Note (Signed)
Age and sex appropriate education and counseling updated with regular exercise and diet ?Referrals for preventative services - none needed ?Immunizations addressed - declines covid booster, flu shot ?Smoking counseling  - counsled to quit, pt not ready ?Evidence for depression or other mood disorder - none significant ?Most recent labs reviewed. ?I have personally reviewed and have noted: ?1) the patient's medical and social history ?2) The patient's current medications and supplements ?3) The patient's height, weight, and BMI have been recorded in the chart ? ?

## 2021-12-27 NOTE — Assessment & Plan Note (Signed)
D/w pt, mild to mod, for mucinex bid prn ?

## 2021-12-27 NOTE — Assessment & Plan Note (Signed)
Ok for linzess prn, delcines GI referral for now ?

## 2021-12-29 MED ORDER — SEMAGLUTIDE-WEIGHT MANAGEMENT 0.25 MG/0.5ML ~~LOC~~ SOAJ: 0.2500 mg | SUBCUTANEOUS | 0 refills | Status: AC

## 2021-12-29 MED ORDER — SEMAGLUTIDE-WEIGHT MANAGEMENT 1.7 MG/0.75ML ~~LOC~~ SOAJ: 1.7000 mg | SUBCUTANEOUS | 0 refills | Status: AC

## 2021-12-29 MED ORDER — SEMAGLUTIDE-WEIGHT MANAGEMENT 2.4 MG/0.75ML ~~LOC~~ SOAJ: 2.4000 mg | SUBCUTANEOUS | 5 refills | Status: AC

## 2021-12-29 MED ORDER — SEMAGLUTIDE-WEIGHT MANAGEMENT 0.5 MG/0.5ML ~~LOC~~ SOAJ: 0.5000 mg | SUBCUTANEOUS | 0 refills | Status: AC

## 2021-12-29 MED ORDER — SEMAGLUTIDE-WEIGHT MANAGEMENT 1 MG/0.5ML ~~LOC~~ SOAJ: 1.0000 mg | SUBCUTANEOUS | 0 refills | Status: AC

## 2022-01-14 ENCOUNTER — Ambulatory Visit (INDEPENDENT_AMBULATORY_CARE_PROVIDER_SITE_OTHER): Payer: 59 | Admitting: Family Medicine

## 2022-01-28 ENCOUNTER — Ambulatory Visit (INDEPENDENT_AMBULATORY_CARE_PROVIDER_SITE_OTHER): Payer: 59 | Admitting: Family Medicine

## 2022-03-11 IMAGING — DX DG CHEST 2V
2 series · 2 of 2 positions shown · non-contrast
Comparison: Chest x-ray dated June 22, 2017.

CLINICAL DATA: Chronic bilateral hand pain. Concern for thoracic
outlet syndrome.

EXAM:
CHEST - 2 VIEW

[chest pa]
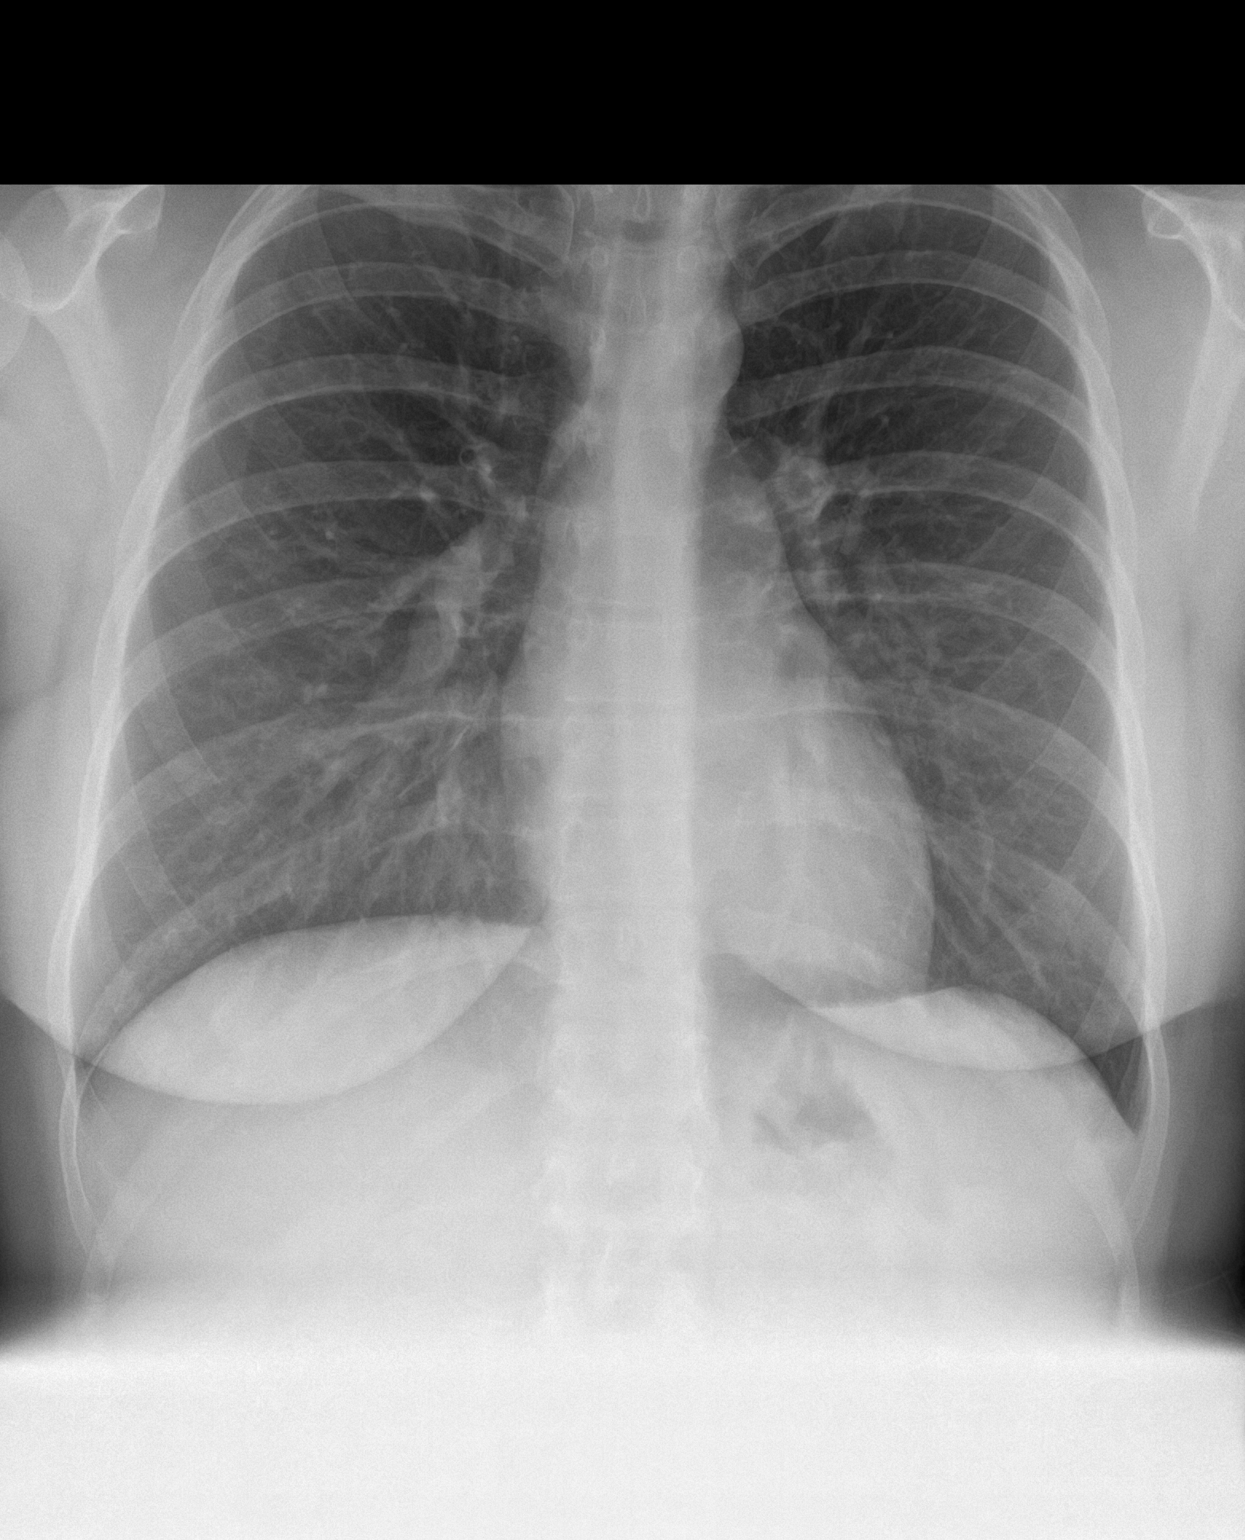

[chest lat]
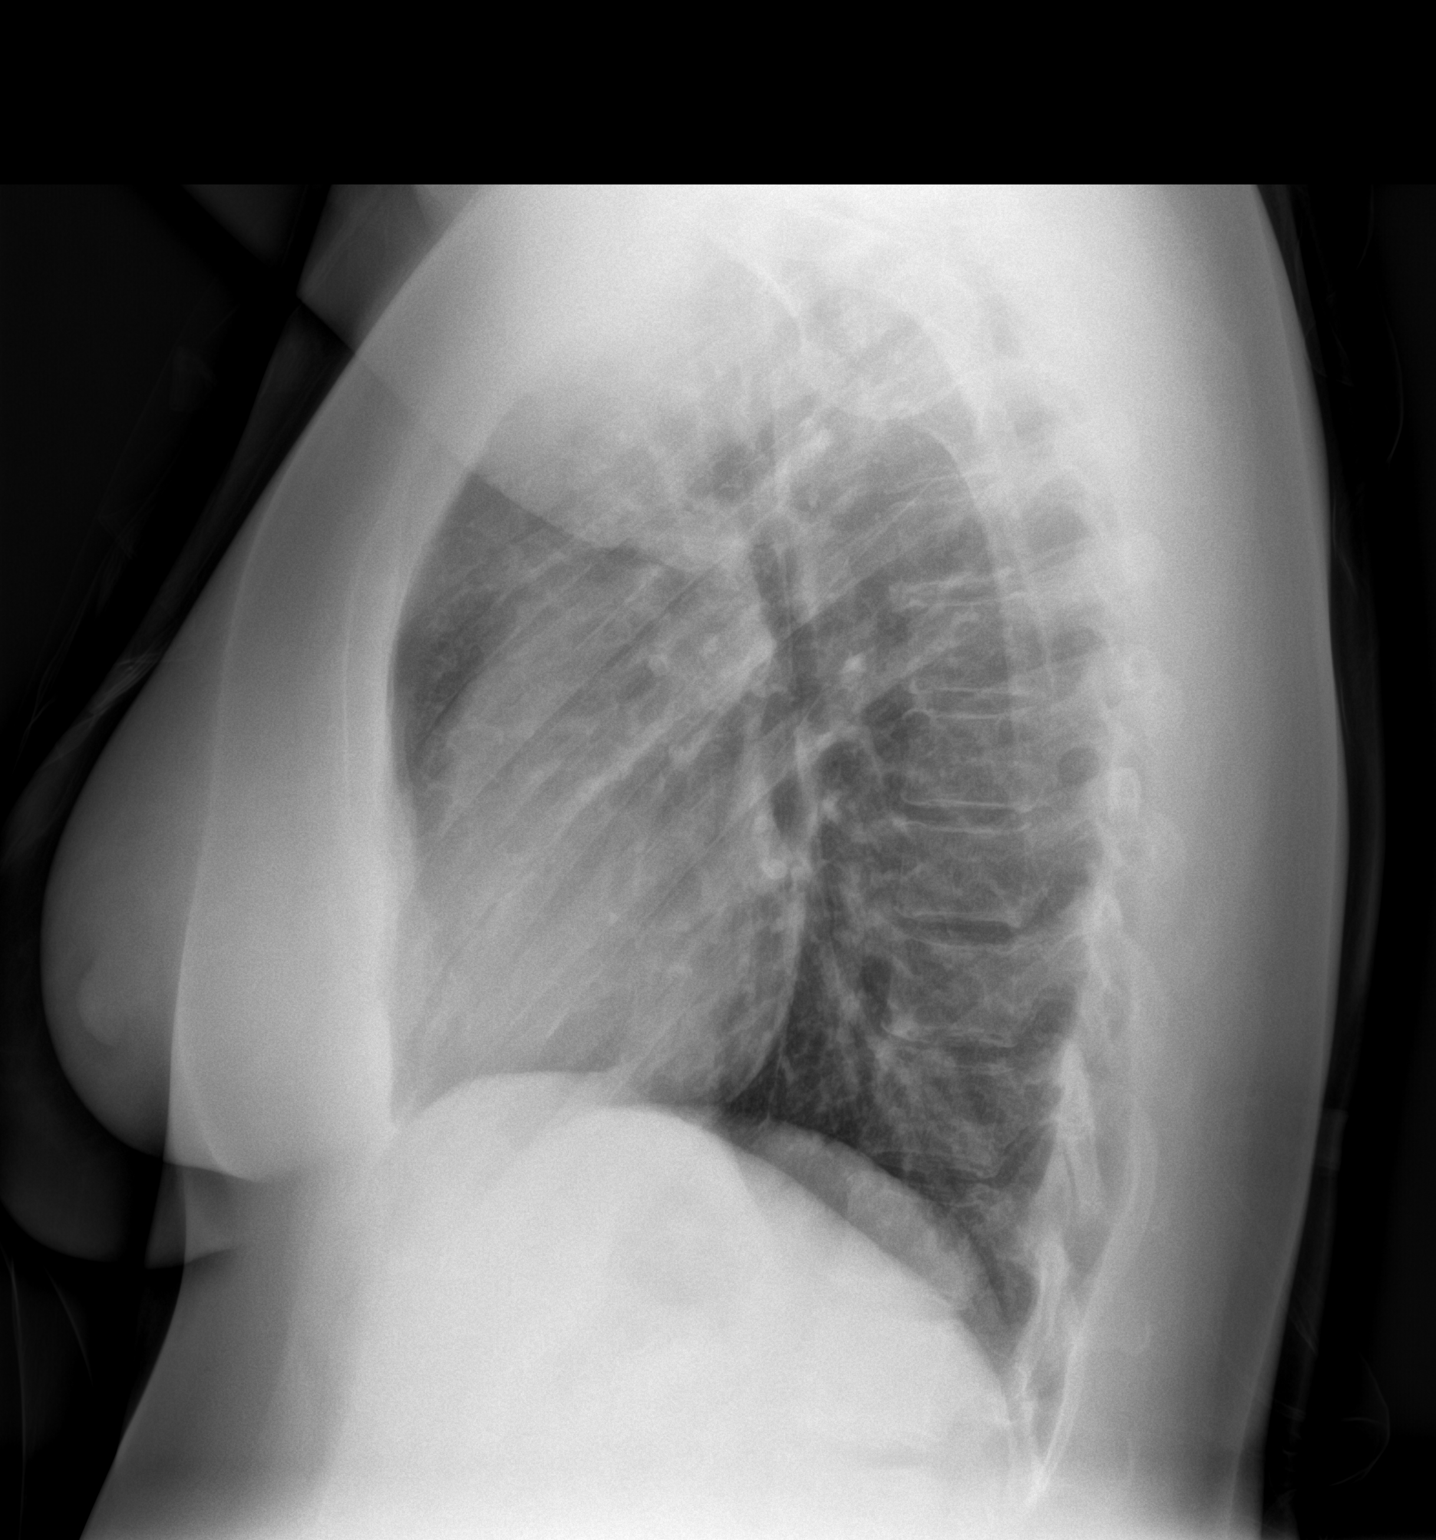

[2 of 2 positions shown; findings below may reference images not displayed]

FINDINGS: The heart size and mediastinal contours are within normal limits.
Both lungs are clear. The visualized skeletal structures are
unremarkable. No cervical ribs.
IMPRESSION: No active cardiopulmonary disease.

## 2022-05-26 ENCOUNTER — Encounter (INDEPENDENT_AMBULATORY_CARE_PROVIDER_SITE_OTHER): Payer: Self-pay

## 2022-05-31 ENCOUNTER — Ambulatory Visit: Payer: Commercial Managed Care - HMO | Admitting: Internal Medicine

## 2022-06-01 ENCOUNTER — Ambulatory Visit: Payer: Managed Care, Other (non HMO) | Admitting: Physician Assistant

## 2022-10-05 ENCOUNTER — Telehealth: Payer: Self-pay | Admitting: Internal Medicine

## 2022-10-05 MED ORDER — OSELTAMIVIR PHOSPHATE 75 MG PO CAPS: 75.0000 mg | ORAL_CAPSULE | Freq: Two times a day (BID) | ORAL | 0 refills | Status: AC

## 2022-10-05 NOTE — Telephone Encounter (Signed)
Ok done now

## 2022-10-05 NOTE — Telephone Encounter (Signed)
Patient's son and daughter have tested positive for the flu - patient would like Dr. Jonny Ruiz to call in tamiflu for her.  She is started to feel sick also  Pharmacy:  [facility] on [address]  Patient's Number:  [phone]

## 2022-10-05 NOTE — Telephone Encounter (Signed)
Notified pt MD sent rx to [facility] 

## 2023-01-17 ENCOUNTER — Telehealth: Payer: Self-pay | Admitting: Internal Medicine

## 2023-01-17 NOTE — Telephone Encounter (Signed)
Pt called stating that last time she saw Dr. Jenny Reichmann he gave her a prescription for [facility] but her insurance didn't cover it. She stated she has new insurance now that would cover Monjaro. She is wondering if he would send in her a prescription for that.

## 2023-01-19 NOTE — Telephone Encounter (Signed)
Unfortunately, the mounjaro will be denies as well due to no diagnosis of DM, sorry

## 2023-01-21 ENCOUNTER — Encounter: Payer: Self-pay | Admitting: Radiology

## 2023-01-24 NOTE — Telephone Encounter (Signed)
Spoke with pt and she still wants to see if the mounjaro will be approved through a PA

## 2023-02-01 NOTE — Telephone Encounter (Signed)
Pt has not seen md since 12/2021. No rx can be sent to pharmacy w/o office visit. Sent pt message stating need OV.Marland KitchenRaechel Jones

## 2023-02-01 NOTE — Telephone Encounter (Signed)
Patient states that she would still like you to send this in to see if the insurance will pay for it.  It doesn't look like it was sent in on the 5th Alicia Jones)

## 2023-06-10 ENCOUNTER — Telehealth (INDEPENDENT_AMBULATORY_CARE_PROVIDER_SITE_OTHER): Payer: Medicaid Other | Admitting: Internal Medicine

## 2023-06-10 DIAGNOSIS — U071 COVID-19: Secondary | ICD-10-CM

## 2023-06-10 DIAGNOSIS — H6993 Unspecified Eustachian tube disorder, bilateral: Secondary | ICD-10-CM

## 2023-06-10 DIAGNOSIS — E559 Vitamin D deficiency, unspecified: Secondary | ICD-10-CM

## 2023-06-10 MED ORDER — HYDROCODONE BIT-HOMATROP MBR 5-1.5 MG/5ML PO SOLN: 5.0000 mL | Freq: Four times a day (QID) | ORAL | 0 refills | Status: AC | PRN

## 2023-06-10 MED ORDER — NIRMATRELVIR/RITONAVIR (PAXLOVID)TABLET: 3.0000 | ORAL_TABLET | Freq: Two times a day (BID) | ORAL | 0 refills | Status: AC

## 2023-06-10 NOTE — Progress Notes (Unsigned)
Patient ID: Alicia Jones, female   DOB: 03/18/1986, 37 y.o.   MRN: [id number]  Virtual Visit via Video Note  I connected with [name] on 06/11/23 at  3:20 PM EDT by a video enabled telemedicine application and verified that I am speaking with the correct person using two identifiers.  Location of all participants today Patient: at home Provider: at office   I discussed the limitations of evaluation and management by telemedicine and the availability of in person appointments. The patient expressed understanding and agreed to proceed.  History of Present Illness:  Here with 3 days acute onset fever, facial pain, pressure, headache, general weakness and malaise,, with mild ST and cough, but pt denies chest pain, wheezing, increased sob or doe, orthopnea, PND, increased LE swelling, palpitations, dizziness or syncope. Found to have covid + testing at home yesterday.  No nausea or diarrhea or sob.   Pt denies polydipsia, polyuria, or new focal neuro s/s.    Pt denies recent wt loss, night sweats, loss of appetite, or other constitutional symptoms  Has bialteral ear popping and crackling.   Past Medical History:  Diagnosis Date   Abdominal pain    Abdominal pain, periumbilical    Abnormal Pap smear of cervix 2012   [facility].OB/[facility], had colpo but no treatment   Acute tonsillitis 09/13/2008   ALLERGIC RHINITIS 02/07/2008   Chest pain    Chest pain    Constipation    Diarrhea    Easy bruising    Generalized headaches    MIGRAINE HEADACHE 02/03/2010   with aura   Nausea and vomiting    PCOS (polycystic ovarian syndrome)    SPRAIN&STRAIN OTH SPEC SITES SHOULDER&UPPER ARM 02/03/2010   SVD (spontaneous vaginal delivery) 05/29/2016   Weakness    Weight loss, unintentional    Past Surgical History:  Procedure Laterality Date   CHOLECYSTECTOMY  07/2011   LAPAROSCOPIC CHOLECYSTECTOMY W/ CHOLANGIOGRAPHY  07/12/11    reports that she has been smoking cigarettes. She has never used smokeless  tobacco. She reports current alcohol use of about 6.0 standard drinks of alcohol per week. She reports that she does not use drugs. family history includes Breast cancer in her maternal grandmother; Cancer in her maternal grandmother, paternal grandfather, and paternal grandmother; Diabetes in her father; Gallbladder disease in her father; Hypertension in her father; Liver disease in her father; Rheum arthritis in her father. No Known Allergies Current Outpatient Medications on File Prior to Visit  Medication Sig Dispense Refill   citalopram (CELEXA) 20 MG tablet Take 1 tablet (20 mg total) by mouth daily. 90 tablet 3   hydrocortisone (ANUSOL-HC) 2.5 % rectal cream Place 1 application rectally 2 (two) times daily. (Patient not taking: Reported on 12/22/2021) 30 g 1   linaclotide (LINZESS) 72 MCG capsule Take 1 capsule (72 mcg total) by mouth daily before breakfast. 90 capsule 1   Multiple Vitamin (MULTIVITAMIN) capsule Take 1 capsule by mouth daily.     oseltamivir (TAMIFLU) 75 MG capsule Take 1 capsule (75 mg total) by mouth 2 (two) times daily. 10 capsule 0   phentermine 37.5 MG capsule Take 1 capsule (37.5 mg total) by mouth every morning. 90 capsule 0   No current facility-administered medications on file prior to visit.    Observations/Objective: Alert, NAD, appropriate mood and affect, resps normal, cn 2-12 intact, moves all 4s, no visible rash or swelling Lab Results  Component Value Date   WBC 5.1 12/22/2021   HGB 14.3 12/22/2021  HCT 39.9 12/22/2021   PLT 219.0 12/22/2021   GLUCOSE 80 12/22/2021   CHOL 158 12/22/2021   TRIG 89.0 12/22/2021   HDL 66.90 12/22/2021   LDLCALC 73 12/22/2021   ALT 23 12/22/2021   AST 18 12/22/2021   NA 137 12/22/2021   K 4.3 12/22/2021   CL 102 12/22/2021   CREATININE 0.72 12/22/2021   BUN 14 12/22/2021   CO2 28 12/22/2021   TSH 1.51 12/22/2021   INR 0.9 04/02/2014   HGBA1C 4.7 12/22/2021   Assessment and Plan: See notes  Follow Up  Instructions: See notes   I discussed the assessment and treatment plan with the patient. The patient was provided an opportunity to ask questions and all were answered. The patient agreed with the plan and demonstrated an understanding of the instructions.   The patient was advised to call back or seek an in-person evaluation if the symptoms worsen or if the condition fails to improve as anticipated.   Oliver Barre, MD

## 2023-06-11 ENCOUNTER — Encounter: Payer: Self-pay | Admitting: Internal Medicine

## 2023-06-11 DIAGNOSIS — U071 COVID-19: Secondary | ICD-10-CM | POA: Insufficient documentation

## 2023-06-11 NOTE — Patient Instructions (Signed)
Please take all new medication as prescribed 

## 2023-06-11 NOTE — Assessment & Plan Note (Signed)
Mild to mod, for antibx paxlovid course, cough med prn,  to f/u any worsening symptoms or concerns

## 2023-06-11 NOTE — Assessment & Plan Note (Signed)
Also for mucinex bid prn

## 2023-06-11 NOTE — Assessment & Plan Note (Signed)
Last vitamin D Lab Results  Component Value Date   VD25OH 37.35 12/22/2021   Low, to start oral replacement
